# Patient Record
Sex: Female | Born: 1950
Health system: Southern US, Community
[De-identification: ages and names within clinical notes are randomized; demographics above are authoritative.]

## PROBLEM LIST (undated history)

## (undated) DIAGNOSIS — F329 Major depressive disorder, single episode, unspecified: Secondary | ICD-10-CM

## (undated) DIAGNOSIS — E785 Hyperlipidemia, unspecified: Secondary | ICD-10-CM

## (undated) DIAGNOSIS — K648 Other hemorrhoids: Secondary | ICD-10-CM

## (undated) DIAGNOSIS — F32A Depression, unspecified: Secondary | ICD-10-CM

## (undated) HISTORY — DX: Depression, unspecified: F32.A

## (undated) HISTORY — DX: Major depressive disorder, single episode, unspecified: F32.9

## (undated) HISTORY — DX: Hyperlipidemia, unspecified: E78.5

## (undated) HISTORY — DX: Other hemorrhoids: K64.8

---

## 1999-01-14 ENCOUNTER — Ambulatory Visit (HOSPITAL_COMMUNITY): Admission: RE | Admit: 1999-01-14 | Discharge: 1999-01-14 | Payer: Self-pay | Admitting: *Deleted

## 1999-01-14 ENCOUNTER — Encounter: Payer: Self-pay | Admitting: Family Medicine

## 1999-04-12 ENCOUNTER — Encounter: Payer: Self-pay | Admitting: Family Medicine

## 1999-04-12 ENCOUNTER — Ambulatory Visit (HOSPITAL_COMMUNITY): Admission: RE | Admit: 1999-04-12 | Discharge: 1999-04-12 | Payer: Self-pay | Admitting: *Deleted

## 2000-03-13 ENCOUNTER — Other Ambulatory Visit: Admission: RE | Admit: 2000-03-13 | Discharge: 2000-03-13 | Payer: Self-pay | Admitting: Obstetrics and Gynecology

## 2001-10-25 ENCOUNTER — Encounter: Payer: Self-pay | Admitting: Emergency Medicine

## 2001-10-25 ENCOUNTER — Emergency Department (HOSPITAL_COMMUNITY): Admission: EM | Admit: 2001-10-25 | Discharge: 2001-10-25 | Payer: Self-pay | Admitting: Emergency Medicine

## 2002-07-14 ENCOUNTER — Ambulatory Visit: Admission: RE | Admit: 2002-07-14 | Discharge: 2002-07-14 | Payer: Self-pay | Admitting: Family Medicine

## 2003-07-20 ENCOUNTER — Encounter: Payer: Self-pay | Admitting: Family Medicine

## 2003-07-20 ENCOUNTER — Ambulatory Visit (HOSPITAL_COMMUNITY): Admission: RE | Admit: 2003-07-20 | Discharge: 2003-07-20 | Payer: Self-pay | Admitting: Family Medicine

## 2003-09-29 ENCOUNTER — Other Ambulatory Visit: Admission: RE | Admit: 2003-09-29 | Discharge: 2003-09-29 | Payer: Self-pay | Admitting: Family Medicine

## 2004-04-01 HISTORY — PX: COLONOSCOPY: SHX174

## 2004-10-21 ENCOUNTER — Ambulatory Visit (HOSPITAL_COMMUNITY): Admission: RE | Admit: 2004-10-21 | Discharge: 2004-10-21 | Payer: Self-pay | Admitting: Family Medicine

## 2004-11-12 ENCOUNTER — Other Ambulatory Visit: Admission: RE | Admit: 2004-11-12 | Discharge: 2004-11-12 | Payer: Self-pay | Admitting: Family Medicine

## 2005-06-11 ENCOUNTER — Ambulatory Visit (HOSPITAL_COMMUNITY): Admission: RE | Admit: 2005-06-11 | Discharge: 2005-06-11 | Payer: Self-pay | Admitting: Family Medicine

## 2005-07-11 ENCOUNTER — Ambulatory Visit (HOSPITAL_BASED_OUTPATIENT_CLINIC_OR_DEPARTMENT_OTHER): Admission: RE | Admit: 2005-07-11 | Discharge: 2005-07-11 | Payer: Self-pay | Admitting: Family Medicine

## 2005-07-13 ENCOUNTER — Ambulatory Visit: Payer: Self-pay | Admitting: Internal Medicine

## 2006-04-14 ENCOUNTER — Other Ambulatory Visit: Admission: RE | Admit: 2006-04-14 | Discharge: 2006-04-14 | Payer: Self-pay | Admitting: Family Medicine

## 2006-05-01 ENCOUNTER — Encounter: Admission: RE | Admit: 2006-05-01 | Discharge: 2006-05-01 | Payer: Self-pay | Admitting: Family Medicine

## 2012-08-17 ENCOUNTER — Telehealth: Payer: Self-pay | Admitting: Gastroenterology

## 2012-08-17 NOTE — Telephone Encounter (Signed)
I have left a message for Angelique Blonder to return my call.

## 2012-08-18 NOTE — Telephone Encounter (Signed)
Left message for patient to call back  

## 2012-08-19 NOTE — Telephone Encounter (Signed)
Unable to reach [facility].  I have contacted the patient , she reports no further bleeding since seeing [facility].  She is offered an appt for 09/14/12, but unable to come due to work schedules.  She is scheduled to see Dr. Russella Dar on 09/27/12, she will call me back if the bleeding returns and we will work her in with one of the extenders.

## 2012-09-08 ENCOUNTER — Encounter: Payer: Self-pay | Admitting: Gastroenterology

## 2012-09-27 ENCOUNTER — Ambulatory Visit (INDEPENDENT_AMBULATORY_CARE_PROVIDER_SITE_OTHER): Payer: Managed Care, Other (non HMO) | Admitting: Gastroenterology

## 2012-09-27 VITALS — BP 110/70 | HR 76 | Ht 65.25 in | Wt 201.0 lb

## 2012-09-27 DIAGNOSIS — K921 Melena: Secondary | ICD-10-CM

## 2012-09-27 MED ORDER — PEG-KCL-NACL-NASULF-NA ASC-C 100 G PO SOLR: 1.0000 | Freq: Once | ORAL | Status: DC

## 2012-09-27 NOTE — Patient Instructions (Addendum)
You have been scheduled for a colonoscopy with propofol. Please follow written instructions given to you at your visit today.  Please pick up your prep kit at the pharmacy within the next 1-3 days. If you use inhalers (even only as needed) or a CPAP machine, please bring them with you on the day of your procedure.  cc: Bennie Pierini, NP

## 2012-09-27 NOTE — Progress Notes (Signed)
History of Present Illness: This is a 61 year old female who relates a 6 month history of intermittent small-volume bright red blood per rectum. She had one episode of a large amount of blood per rectum and she was evaluated by her PCP. Blood work unremarkable. She previously underwent colonoscopy 2005 showing internal hemorrhoids. She has not noted any rectal bleeding since the visit to her PCP office in mid-September. Denies weight loss, abdominal pain, constipation, diarrhea, change in stool caliber, melena, nausea, vomiting, dysphagia, reflux symptoms, chest pain.  Review of mid-September. Systems: Pertinent positive and negative review of systems were noted in the above HPI section. All other review of systems were otherwise negative.  Current Medications, Allergies, Past Medical History, Past Surgical History, Family History and Social History were reviewed in [employer] record.  Physical Exam: General: Well developed , well nourished, no acute distress Head: Normocephalic and atraumatic Eyes:  sclerae anicteric, EOMI Ears: Normal auditory acuity Mouth: No deformity or lesions Neck: Supple, no masses or thyromegaly Lungs: Clear throughout to auscultation Heart: Regular rate and rhythm; no murmurs, rubs or bruits Abdomen: Soft, non tender and non distended. No masses, hepatosplenomegaly or hernias noted. Normal Bowel sounds Rectal: No lesions, brown Hemoccult negative stool the vault  Musculoskeletal: Symmetrical with no gross deformities  Skin: No lesions on visible extremities Pulses:  Normal pulses noted Extremities: No clubbing, cyanosis, edema or deformities noted Neurological: Alert oriented x 4, grossly nonfocal Cervical Nodes:  No significant cervical adenopathy Inguinal Nodes: No significant inguinal adenopathy Psychological:  Alert and cooperative. Normal mood and affect  Assessment and Recommendations:  1. Hematochezia. Probable hemorrhoidal  bleeding. Rule out colorectal neoplasms and other disorders. Schedule colonoscopy. The risks, benefits, and alternatives to colonoscopy with possible biopsy and possible polypectomy were discussed with the patient and they consent to proceed.

## 2012-10-29 ENCOUNTER — Encounter: Payer: Managed Care, Other (non HMO) | Admitting: Gastroenterology

## 2012-12-10 ENCOUNTER — Encounter: Payer: Managed Care, Other (non HMO) | Admitting: Gastroenterology

## 2013-02-28 ENCOUNTER — Other Ambulatory Visit: Payer: Self-pay | Admitting: Nurse Practitioner

## 2013-03-29 ENCOUNTER — Other Ambulatory Visit: Payer: Self-pay | Admitting: Nurse Practitioner

## 2013-03-30 NOTE — Telephone Encounter (Signed)
LAST LABS 9/13 

## 2013-04-21 ENCOUNTER — Telehealth: Payer: Self-pay | Admitting: Nurse Practitioner

## 2013-04-21 NOTE — Telephone Encounter (Signed)
Patient should be able to call and make no appointment since she is in their system- if can't let me know

## 2013-04-21 NOTE — Telephone Encounter (Signed)
Mmm please address referral

## 2013-04-22 NOTE — Telephone Encounter (Signed)
Left message  (at work). Pt can call to reschedule her appt at [facility].

## 2013-05-11 ENCOUNTER — Encounter: Payer: Self-pay | Admitting: Gastroenterology

## 2013-05-31 ENCOUNTER — Other Ambulatory Visit: Payer: Self-pay

## 2013-05-31 MED ORDER — SIMVASTATIN 40 MG PO TABS: 40.0000 mg | ORAL_TABLET | Freq: Every day | ORAL | Status: DC

## 2013-05-31 NOTE — Telephone Encounter (Signed)
Last lipids 08/11/12  Last seen 08/11/12  MMM

## 2013-06-21 ENCOUNTER — Ambulatory Visit (AMBULATORY_SURGERY_CENTER): Payer: Managed Care, Other (non HMO) | Admitting: *Deleted

## 2013-06-21 VITALS — Ht 66.0 in | Wt 203.2 lb

## 2013-06-21 DIAGNOSIS — K921 Melena: Secondary | ICD-10-CM

## 2013-06-21 NOTE — Progress Notes (Signed)
Previous colon 04-01-2004 w/ hemorrhoids only. ewm No egg or soy product allergy. ewm Nothing electrical in heart, no home 02 use. ewm Has been diagnosed with mild OSA but doesn't use CPAP. ewm No email available for emmi video. ewm No problems with last sedation w/ colon. ewm Pt has moviprep at home, unmixed from last scheduled colon so no refill sent to pharmacy. ewm

## 2013-06-28 ENCOUNTER — Encounter: Payer: Self-pay | Admitting: Gastroenterology

## 2013-07-12 ENCOUNTER — Other Ambulatory Visit: Payer: Self-pay

## 2013-07-12 NOTE — Telephone Encounter (Signed)
Last lipids drawn 08/11/12   MMM

## 2013-07-12 NOTE — Telephone Encounter (Signed)
No refill ntbs for labs

## 2013-07-14 ENCOUNTER — Encounter: Payer: Self-pay | Admitting: Gastroenterology

## 2013-07-14 ENCOUNTER — Ambulatory Visit (AMBULATORY_SURGERY_CENTER): Payer: Managed Care, Other (non HMO) | Admitting: Gastroenterology

## 2013-07-14 VITALS — BP 123/68 | HR 75 | Temp 98.4°F | Resp 14 | Ht 66.0 in | Wt 203.0 lb

## 2013-07-14 DIAGNOSIS — K921 Melena: Secondary | ICD-10-CM

## 2013-07-14 DIAGNOSIS — D126 Benign neoplasm of colon, unspecified: Secondary | ICD-10-CM

## 2013-07-14 DIAGNOSIS — K648 Other hemorrhoids: Secondary | ICD-10-CM

## 2013-07-14 MED ORDER — SODIUM CHLORIDE 0.9 % IV SOLN: 500.0000 mL | INTRAVENOUS | Status: DC

## 2013-07-14 NOTE — Patient Instructions (Addendum)

## 2013-07-14 NOTE — Progress Notes (Signed)
Patient did not experience any of the following events: a burn prior to discharge; a fall within the facility; wrong site/side/patient/procedure/implant event; or a hospital transfer or hospital admission upon discharge from the facility. (G8907) Patient did not have preoperative order for IV antibiotic SSI prophylaxis. (G8918)  

## 2013-07-14 NOTE — Op Note (Signed)
[facility] [address] [place], [address]   COLONOSCOPY PROCEDURE REPORT  PATIENT: Mikayla Knight, Mikayla Knight  MR#: [id number] BIRTHDATE: [birth year] , 62  yrs. old GENDER: Female ENDOSCOPIST: Meryl Dare, MD, [facility] PROCEDURE DATE:  07/14/2013 PROCEDURE:   Hemorrhoidectomy via sclerosing and Colonoscopy with snare polypectomy First Screening Colonoscopy - Avg.  risk and is 50 yrs.  old or older - No.  Prior Negative Screening - Now for repeat screening. N/A  History of Adenoma - Now for follow-up colonoscopy & has been > or = to 3 yrs.  N/A  Polyps Removed Today? No.  Recommend repeat exam, <10 yrs? No. ASA CLASS:   Class II INDICATIONS:hematochezia. MEDICATIONS: MAC sedation, administered by CRNA and propofol (Diprivan) 300mg  IV DESCRIPTION OF PROCEDURE:   After the risks benefits and alternatives of the procedure were thoroughly explained, informed consent was obtained.  A digital rectal exam revealed no abnormalities of the rectum.   The LB [id number]  endoscope was introduced through the anus and advanced to the cecum, which was identified by both the appendix and ileocecal valve. No adverse events experienced with a tortuous colon.   The quality of the prep was excellent, using MoviPrep  The instrument was then slowly withdrawn as the colon was fully examined.  COLON FINDINGS: A sessile polyp measuring 6 mm in size was found in the transverse colon.  A polypectomy was performed with a cold snare.  The resection was complete and the polyp tissue was completely retrieved.   The colon was otherwise normal.  There was no diverticulosis, inflammation, polyps or cancers unless previously stated.  Retroflexed views revealed moderate internal hemorrhoids. 2 cc of 23.4% saline was injected into the internal hemorrhoids above the dentate line. The time to cecum=7 minutes 56 seconds.  Withdrawal time=14 minutes 30 seconds.  The scope was withdrawn and the procedure  completed. COMPLICATIONS: There were no complications.  ENDOSCOPIC IMPRESSION: 1.   Sessile polyp measuring 6 mm in the transverse colon; polypectomy performed with a cold snare 2.   Moderate internal hemorrhoids  RECOMMENDATIONS: 1.  Colonoscopy in 5 years if polyps is adenomatous, otherwise repeat colonoscopy in 10 years. 2.  Await polyp pathology   eSigned:  Meryl Dare, MD, [facility] 07/14/2013 11:03 AM   cc: Bennie Pierini, NP

## 2013-07-14 NOTE — Progress Notes (Signed)
Called to room to assist during endoscopic procedure.  Patient ID and intended procedure confirmed with present staff. Received instructions for my participation in the procedure from the performing physician.  

## 2013-07-14 NOTE — Progress Notes (Signed)
Report to pacu rn, vss, bbs=clear 

## 2013-07-15 ENCOUNTER — Telehealth: Payer: Self-pay | Admitting: *Deleted

## 2013-07-15 ENCOUNTER — Encounter: Payer: Self-pay | Admitting: *Deleted

## 2013-07-15 ENCOUNTER — Other Ambulatory Visit: Payer: Self-pay

## 2013-07-15 MED ORDER — SIMVASTATIN 40 MG PO TABS: 40.0000 mg | ORAL_TABLET | Freq: Every day | ORAL | Status: DC

## 2013-07-15 NOTE — Telephone Encounter (Signed)
Message left

## 2013-07-15 NOTE — Telephone Encounter (Signed)
Last lipids 08/11/12  MMM

## 2013-07-19 ENCOUNTER — Encounter: Payer: Self-pay | Admitting: Gastroenterology

## 2013-09-05 ENCOUNTER — Other Ambulatory Visit: Payer: Self-pay | Admitting: Nurse Practitioner

## 2013-09-06 ENCOUNTER — Other Ambulatory Visit: Payer: Self-pay | Admitting: Nurse Practitioner

## 2013-09-29 ENCOUNTER — Ambulatory Visit (INDEPENDENT_AMBULATORY_CARE_PROVIDER_SITE_OTHER): Payer: Managed Care, Other (non HMO) | Admitting: Nurse Practitioner

## 2013-09-29 ENCOUNTER — Encounter: Payer: Self-pay | Admitting: Nurse Practitioner

## 2013-09-29 VITALS — BP 132/84 | HR 80 | Temp 99.1°F | Ht 66.0 in | Wt 203.0 lb

## 2013-09-29 DIAGNOSIS — E785 Hyperlipidemia, unspecified: Secondary | ICD-10-CM

## 2013-09-29 DIAGNOSIS — F329 Major depressive disorder, single episode, unspecified: Secondary | ICD-10-CM

## 2013-09-29 DIAGNOSIS — F32A Depression, unspecified: Secondary | ICD-10-CM

## 2013-09-29 MED ORDER — PAROXETINE HCL ER 37.5 MG PO TB24: 37.5000 mg | ORAL_TABLET | Freq: Every day | ORAL | Status: DC

## 2013-09-29 NOTE — Patient Instructions (Signed)
Fat and Cholesterol Control Diet  Fat and cholesterol levels in your blood and organs are influenced by your diet. High levels of fat and cholesterol may lead to diseases of the heart, small and large blood vessels, gallbladder, liver, and pancreas.  CONTROLLING FAT AND CHOLESTEROL WITH DIET  Although exercise and lifestyle factors are important, your diet is key. That is because certain foods are known to raise cholesterol and others to lower it. The goal is to balance foods for their effect on cholesterol and more importantly, to replace saturated and trans fat with other types of fat, such as monounsaturated fat, polyunsaturated fat, and omega-3 fatty acids.  On average, a person should consume no more than 15 to 17 g of saturated fat daily. Saturated and trans fats are considered "bad" fats, and they will raise LDL cholesterol. Saturated fats are primarily found in animal products such as meats, butter, and cream. However, that does not mean you need to give up all your favorite foods. Today, there are good tasting, low-fat, low-cholesterol substitutes for most of the things you like to eat. Choose low-fat or nonfat alternatives. Choose round or loin cuts of red meat. These types of cuts are lowest in fat and cholesterol. Chicken (without the skin), fish, veal, and ground turkey breast are great choices. Eliminate fatty meats, such as hot dogs and salami. Even shellfish have little or no saturated fat. Have a 3 oz (85 g) portion when you eat lean meat, poultry, or fish.  Trans fats are also called "partially hydrogenated oils." They are oils that have been scientifically manipulated so that they are solid at room temperature resulting in a longer shelf life and improved taste and texture of foods in which they are added. Trans fats are found in stick margarine, some tub margarines, cookies, crackers, and baked goods.   When baking and cooking, oils are a great substitute for butter. The monounsaturated oils are  especially beneficial since it is believed they lower LDL and raise HDL. The oils you should avoid entirely are saturated tropical oils, such as coconut and palm.   Remember to eat a lot from food groups that are naturally free of saturated and trans fat, including fish, fruit, vegetables, beans, grains (barley, rice, couscous, bulgur wheat), and pasta (without cream sauces).   IDENTIFYING FOODS THAT LOWER FAT AND CHOLESTEROL   Soluble fiber may lower your cholesterol. This type of fiber is found in fruits such as apples, vegetables such as broccoli, potatoes, and carrots, legumes such as beans, peas, and lentils, and grains such as barley. Foods fortified with plant sterols (phytosterol) may also lower cholesterol. You should eat at least 2 g per day of these foods for a cholesterol lowering effect.   Read package labels to identify low-saturated fats, trans fat free, and low-fat foods at the supermarket. Select cheeses that have only 2 to 3 g saturated fat per ounce. Use a heart-healthy tub margarine that is free of trans fats or partially hydrogenated oil. When buying baked goods (cookies, crackers), avoid partially hydrogenated oils. Breads and muffins should be made from whole grains (whole-wheat or whole oat flour, instead of "flour" or "enriched flour"). Buy non-creamy canned soups with reduced salt and no added fats.   FOOD PREPARATION TECHNIQUES   Never deep-fry. If you must fry, either stir-fry, which uses very little fat, or use non-stick cooking sprays. When possible, broil, bake, or roast meats, and steam vegetables. Instead of putting butter or margarine on vegetables, use lemon   and herbs, applesauce, and cinnamon (for squash and sweet potatoes). Use nonfat yogurt, salsa, and low-fat dressings for salads.   LOW-SATURATED FAT / LOW-FAT FOOD SUBSTITUTES  Meats / Saturated Fat (g)  · Avoid: Steak, marbled (3 oz/85 g) / 11 g  · Choose: Steak, lean (3 oz/85 g) / 4 g  · Avoid: Hamburger (3 oz/85 g) / 7  g  · Choose: Hamburger, lean (3 oz/85 g) / 5 g  · Avoid: Ham (3 oz/85 g) / 6 g  · Choose: Ham, lean cut (3 oz/85 g) / 2.4 g  · Avoid: Chicken, with skin, dark meat (3 oz/85 g) / 4 g  · Choose: Chicken, skin removed, dark meat (3 oz/85 g) / 2 g  · Avoid: Chicken, with skin, light meat (3 oz/85 g) / 2.5 g  · Choose: Chicken, skin removed, light meat (3 oz/85 g) / 1 g  Dairy / Saturated Fat (g)  · Avoid: Whole milk (1 cup) / 5 g  · Choose: Low-fat milk, 2% (1 cup) / 3 g  · Choose: Low-fat milk, 1% (1 cup) / 1.5 g  · Choose: Skim milk (1 cup) / 0.3 g  · Avoid: Hard cheese (1 oz/28 g) / 6 g  · Choose: Skim milk cheese (1 oz/28 g) / 2 to 3 g  · Avoid: Cottage cheese, 4% fat (1 cup) / 6.5 g  · Choose: Low-fat cottage cheese, 1% fat (1 cup) / 1.5 g  · Avoid: Ice cream (1 cup) / 9 g  · Choose: Sherbet (1 cup) / 2.5 g  · Choose: Nonfat frozen yogurt (1 cup) / 0.3 g  · Choose: Frozen fruit bar / trace  · Avoid: Whipped cream (1 tbs) / 3.5 g  · Choose: Nondairy whipped topping (1 tbs) / 1 g  Condiments / Saturated Fat (g)  · Avoid: Mayonnaise (1 tbs) / 2 g  · Choose: Low-fat mayonnaise (1 tbs) / 1 g  · Avoid: Butter (1 tbs) / 7 g  · Choose: Extra light margarine (1 tbs) / 1 g  · Avoid: Coconut oil (1 tbs) / 11.8 g  · Choose: Olive oil (1 tbs) / 1.8 g  · Choose: Corn oil (1 tbs) / 1.7 g  · Choose: Safflower oil (1 tbs) / 1.2 g  · Choose: Sunflower oil (1 tbs) / 1.4 g  · Choose: Soybean oil (1 tbs) / 2.4 g  · Choose: Canola oil (1 tbs) / 1 g  Document Released: 11/10/2005 Document Revised: 03/07/2013 Document Reviewed: 05/01/2011  ExitCare® Patient Information ©2014 [employer].

## 2013-09-29 NOTE — Progress Notes (Signed)
  Subjective:    Patient ID: Mikayla Knight, female    DOB: June 01, 1951, 62 y.o.   MRN: [id number]  Hyperlipidemia This is a chronic problem. The current episode started more than 1 year ago. The problem is uncontrolled. Recent lipid tests were reviewed and are high. Exacerbating diseases include obesity. She has no history of diabetes or hypothyroidism. There are no known factors aggravating her hyperlipidemia. Pertinent negatives include no focal sensory loss, focal weakness, leg pain or myalgias. Shortness of breath: slight with exertion. Treatments tried: patient stopped taking statin 6 months ago. The current treatment provides no improvement of lipids. Compliance problems include adherence to diet and adherence to exercise.  Risk factors for coronary artery disease include obesity and post-menopausal.  depression Paxil- patient has been taking for a long time- no side effects- works well for her.    Review of Systems  Constitutional: Negative.   HENT: Negative.   Eyes: Negative.   Respiratory: Shortness of breath: slight with exertion.   Cardiovascular: Negative.   Gastrointestinal: Negative.   Genitourinary: Negative.   Musculoskeletal: Negative.  Negative for myalgias.  Neurological: Negative.  Negative for focal weakness.  Hematological: Negative.   Psychiatric/Behavioral: Negative.        Objective:   Physical Exam  Constitutional: She is oriented to person, place, and time. She appears well-developed and well-nourished.  HENT:  Nose: Nose normal.  Mouth/Throat: Oropharynx is clear and moist.  Eyes: EOM are normal.  Neck: Trachea normal, normal range of motion and full passive range of motion without pain. Neck supple. No JVD present. Carotid bruit is not present. No thyromegaly present.  Cardiovascular: Normal rate, regular rhythm, normal heart sounds and intact distal pulses.  Exam reveals no gallop and no friction rub.   No murmur heard. Pulmonary/Chest: Effort normal  and breath sounds normal.  Abdominal: Soft. Bowel sounds are normal. She exhibits no distension and no mass. There is no tenderness.  Musculoskeletal: Normal range of motion.  Lymphadenopathy:    She has no cervical adenopathy.  Neurological: She is alert and oriented to person, place, and time. She has normal reflexes.  Skin: Skin is warm and dry.  Psychiatric: She has a normal mood and affect. Her behavior is normal. Judgment and thought content normal.     BP 142/69  Pulse 80  Temp(Src) 99.1 F (37.3 C) (Oral)  Ht 5\' 6"  (1.676 m)  Wt 203 lb (92.08 kg)  BMI 32.78 kg/m2      Assessment & Plan:   1. Depression   2. Hyperlipidemia LDL goal < 100    Orders Placed This Encounter  Procedures  . CMP14+EGFR  . NMR, lipoprofile    Meds ordered this encounter  Medications  . PARoxetine (PAXIL-CR) 37.5 MG 24 hr tablet    Sig: Take 1 tablet (37.5 mg total) by mouth daily.    Dispense:  30 tablet    Refill:  5    Order Specific Question:  Supervising Provider    Answer:  Deborra Medina    Continue all meds Labs pending Diet and exercise encouraged Health maintenance reviewed Follow up in 6 month  Mary-Margaret Daphine Deutscher, FNP

## 2013-10-01 LAB — CMP14+EGFR
Albumin: 4.1 g/dL (ref 3.6–4.8)
Calcium: 9 mg/dL (ref 8.6–10.2)
Chloride: 104 mmol/L (ref 97–108)
Creatinine, Ser: 0.75 mg/dL (ref 0.57–1.00)
GFR calc non Af Amer: 86 mL/min/{1.73_m2} (ref >59)
Glucose: 81 mg/dL (ref 65–99)

## 2013-10-01 LAB — NMR, LIPOPROFILE
LP-IR Score: 58 — ABNORMAL HIGH (ref <=45)
Triglycerides by NMR: 154 mg/dL — ABNORMAL HIGH (ref <150)

## 2013-10-18 ENCOUNTER — Telehealth: Payer: Self-pay | Admitting: Nurse Practitioner

## 2013-10-24 NOTE — Telephone Encounter (Signed)
Patient aware of labs will call back after she reviews everything and will let us know if she wants to go on a statin

## 2014-01-16 ENCOUNTER — Telehealth: Payer: Self-pay | Admitting: Nurse Practitioner

## 2014-01-16 NOTE — Telephone Encounter (Signed)
appt made for sinus

## 2014-01-17 ENCOUNTER — Ambulatory Visit: Payer: Managed Care, Other (non HMO) | Admitting: Family Medicine

## 2014-01-19 ENCOUNTER — Ambulatory Visit: Payer: Managed Care, Other (non HMO) | Admitting: Family Medicine

## 2014-04-21 ENCOUNTER — Other Ambulatory Visit: Payer: Self-pay | Admitting: Nurse Practitioner

## 2014-05-22 ENCOUNTER — Other Ambulatory Visit: Payer: Self-pay | Admitting: Nurse Practitioner

## 2014-05-23 NOTE — Telephone Encounter (Signed)
Last seen 09/29/13  MMM

## 2014-05-23 NOTE — Telephone Encounter (Signed)
Patient NTBS for follow up and lab work for future refills  

## 2014-06-22 ENCOUNTER — Encounter: Payer: Self-pay | Admitting: Nurse Practitioner

## 2014-06-22 ENCOUNTER — Ambulatory Visit (INDEPENDENT_AMBULATORY_CARE_PROVIDER_SITE_OTHER): Payer: BC Managed Care – PPO | Admitting: Nurse Practitioner

## 2014-06-22 VITALS — BP 123/79 | HR 85 | Temp 97.7°F | Ht 66.0 in | Wt 200.0 lb

## 2014-06-22 DIAGNOSIS — E785 Hyperlipidemia, unspecified: Secondary | ICD-10-CM

## 2014-06-22 DIAGNOSIS — Z713 Dietary counseling and surveillance: Secondary | ICD-10-CM

## 2014-06-22 DIAGNOSIS — F32A Depression, unspecified: Secondary | ICD-10-CM

## 2014-06-22 DIAGNOSIS — F329 Major depressive disorder, single episode, unspecified: Secondary | ICD-10-CM

## 2014-06-22 DIAGNOSIS — Z6832 Body mass index (BMI) 32.0-32.9, adult: Secondary | ICD-10-CM

## 2014-06-22 DIAGNOSIS — F3289 Other specified depressive episodes: Secondary | ICD-10-CM

## 2014-06-22 MED ORDER — PAROXETINE HCL ER 37.5 MG PO TB24: ORAL_TABLET | ORAL | Status: DC

## 2014-06-22 NOTE — Patient Instructions (Signed)
Exercise to Lose Weight Exercise and a healthy diet may help you lose weight. Your doctor may suggest specific exercises. EXERCISE IDEAS AND TIPS  Choose low-cost things you enjoy doing, such as walking, bicycling, or exercising to workout videos.  Take stairs instead of the elevator.  Walk during your lunch break.  Park your car further away from work or school.  Go to a gym or an exercise class.  Start with 5 to 10 minutes of exercise each day. Build up to 30 minutes of exercise 4 to 6 days a week.  Wear shoes with good support and comfortable clothes.  Stretch before and after working out.  Work out until you breathe harder and your heart beats faster.  Drink extra water when you exercise.  Do not do so much that you hurt yourself, feel dizzy, or get very short of breath. Exercises that burn about 150 calories:  Running 1  miles in 15 minutes.  Playing volleyball for 45 to 60 minutes.  Washing and waxing a car for 45 to 60 minutes.  Playing touch football for 45 minutes.  Walking 1  miles in 35 minutes.  Pushing a stroller 1  miles in 30 minutes.  Playing basketball for 30 minutes.  Raking leaves for 30 minutes.  Bicycling 5 miles in 30 minutes.  Walking 2 miles in 30 minutes.  Dancing for 30 minutes.  Shoveling snow for 15 minutes.  Swimming laps for 20 minutes.  Walking up stairs for 15 minutes.  Bicycling 4 miles in 15 minutes.  Gardening for 30 to 45 minutes.  Jumping rope for 15 minutes.  Washing windows or floors for 45 to 60 minutes. Document Released: 12/13/2010 Document Revised: 02/02/2012 Document Reviewed: 12/13/2010 ExitCare Patient Information 2015 [employer]. This information is not intended to replace advice given to you by your health care provider. Make sure you discuss any questions you have with your health care provider.  

## 2014-06-22 NOTE — Progress Notes (Signed)
  Subjective:    Patient ID: Mikayla Knight, female    DOB: 04-Jun-1951, 63 y.o.   MRN: [id number]  Patient here today for follow up of chronic medical problems.  Hyperlipidemia This is a chronic problem. The current episode started more than 1 year ago. The problem is uncontrolled. Recent lipid tests were reviewed and are high. Exacerbating diseases include obesity. She has no history of diabetes or hypothyroidism. There are no known factors aggravating her hyperlipidemia. Pertinent negatives include no focal sensory loss, focal weakness, leg pain or myalgias. Shortness of breath: slight with exertion. Treatments tried: patient stopped taking statin 6 months ago says she had slight muscle aches. The current treatment provides no improvement of lipids. Compliance problems include adherence to diet and adherence to exercise.  Risk factors for coronary artery disease include obesity and post-menopausal.  depression Paxil- patient has been taking for a long time- no side effects- works well for her.    Review of Systems  Constitutional: Negative.   HENT: Negative.   Eyes: Negative.   Respiratory: Shortness of breath: slight with exertion.   Cardiovascular: Negative.   Gastrointestinal: Negative.   Genitourinary: Negative.   Musculoskeletal: Negative.  Negative for myalgias.  Neurological: Negative.  Negative for focal weakness.  Hematological: Negative.   Psychiatric/Behavioral: Negative.        Objective:   Physical Exam  Constitutional: She is oriented to person, place, and time. She appears well-developed and well-nourished.  HENT:  Nose: Nose normal.  Mouth/Throat: Oropharynx is clear and moist.  Eyes: EOM are normal.  Neck: Trachea normal, normal range of motion and full passive range of motion without pain. Neck supple. No JVD present. Carotid bruit is not present. No thyromegaly present.  Cardiovascular: Normal rate, regular rhythm, normal heart sounds and intact distal pulses.   Exam reveals no gallop and no friction rub.   No murmur heard. Pulmonary/Chest: Effort normal and breath sounds normal.  Abdominal: Soft. Bowel sounds are normal. She exhibits no distension and no mass. There is no tenderness.  Musculoskeletal: Normal range of motion.  Lymphadenopathy:    She has no cervical adenopathy.  Neurological: She is alert and oriented to person, place, and time. She has normal reflexes.  Skin: Skin is warm and dry.  Psychiatric: She has a normal mood and affect. Her behavior is normal. Judgment and thought content normal.     BP 123/79  Pulse 85  Temp(Src) 97.7 F (36.5 C) (Oral)  Ht $R'5\' 6"'TO$  (1.676 m)  Wt 200 lb (90.719 kg)  BMI 32.30 kg/m2      Assessment & Plan:   1. Hyperlipidemia with target LDL less than 100   2. Depression   3. BMI 32.0-32.9,adult   4. Weight loss counseling, encounter for    Orders Placed This Encounter  Procedures  . CMP14+EGFR  . NMR, lipoprofile   Meds ordered this encounter  Medications  . PARoxetine (PAXIL-CR) 37.5 MG 24 hr tablet    Sig: TAKE ONE TABLET BY MOUTH ONCE DAILY    Dispense:  30 tablet    Refill:  5    Order Specific Question:  Supervising Provider    Answer:  Chipper Herb [[id number]]   Patient to schedule mammogram on mobile truck Labs pending Health maintenance reviewed Diet and exercise encouraged Continue all meds Follow up  In 3 months   [place], FNP

## 2014-06-22 NOTE — Addendum Note (Signed)
Addended by: Selmer Dominion on: 06/22/2014 02:25 PM   Modules accepted: Orders

## 2014-06-23 LAB — CMP14+EGFR
ALT: 12 IU/L (ref 0–32)
AST: 12 IU/L (ref 0–40)
Albumin/Globulin Ratio: 1.9 (ref 1.1–2.5)
Albumin: 4.4 g/dL (ref 3.6–4.8)
Alkaline Phosphatase: 76 IU/L (ref 39–117)
BUN / CREAT RATIO: 26 (ref 11–26)
BUN: 19 mg/dL (ref 8–27)
CALCIUM: 9.3 mg/dL (ref 8.7–10.3)
CHLORIDE: 101 mmol/L (ref 97–108)
CO2: 24 mmol/L (ref 18–29)
CREATININE: 0.74 mg/dL (ref 0.57–1.00)
GFR calc Af Amer: 100 mL/min/{1.73_m2} (ref >59)
GFR, EST NON AFRICAN AMERICAN: 87 mL/min/{1.73_m2} (ref >59)
GLUCOSE: 95 mg/dL (ref 65–99)
Globulin, Total: 2.3 g/dL (ref 1.5–4.5)
Potassium: 5 mmol/L (ref 3.5–5.2)
Sodium: 139 mmol/L (ref 134–144)
Total Bilirubin: 0.4 mg/dL (ref 0.0–1.2)
Total Protein: 6.7 g/dL (ref 6.0–8.5)

## 2014-06-23 LAB — NMR, LIPOPROFILE

## 2014-06-24 LAB — HEPATIC FUNCTION PANEL
ALT: 14 IU/L (ref 0–32)
AST: 13 IU/L (ref 0–40)
Albumin: 4.5 g/dL (ref 3.6–4.8)
Alkaline Phosphatase: 78 IU/L (ref 39–117)
BILIRUBIN TOTAL: 0.4 mg/dL (ref 0.0–1.2)
Bilirubin, Direct: 0.09 mg/dL (ref 0.00–0.40)
TOTAL PROTEIN: 6.8 g/dL (ref 6.0–8.5)

## 2014-06-24 LAB — LIPID PANEL
CHOL/HDL RATIO: 7.3 ratio — AB (ref 0.0–4.4)
Cholesterol, Total: 290 mg/dL — ABNORMAL HIGH (ref 100–199)
HDL: 40 mg/dL (ref >39)
LDL CALC: 213 mg/dL — AB (ref 0–99)
Triglycerides: 187 mg/dL — ABNORMAL HIGH (ref 0–149)
VLDL CHOLESTEROL CAL: 37 mg/dL (ref 5–40)

## 2014-06-24 LAB — SPECIMEN STATUS REPORT

## 2014-10-02 ENCOUNTER — Encounter (INDEPENDENT_AMBULATORY_CARE_PROVIDER_SITE_OTHER): Payer: Self-pay

## 2014-10-02 ENCOUNTER — Ambulatory Visit (INDEPENDENT_AMBULATORY_CARE_PROVIDER_SITE_OTHER): Payer: BC Managed Care – PPO | Admitting: Nurse Practitioner

## 2014-10-02 ENCOUNTER — Encounter: Payer: Self-pay | Admitting: Nurse Practitioner

## 2014-10-02 VITALS — BP 119/76 | HR 79 | Temp 97.2°F | Ht 66.0 in | Wt 202.8 lb

## 2014-10-02 DIAGNOSIS — E785 Hyperlipidemia, unspecified: Secondary | ICD-10-CM

## 2014-10-02 DIAGNOSIS — F329 Major depressive disorder, single episode, unspecified: Secondary | ICD-10-CM

## 2014-10-02 DIAGNOSIS — F32A Depression, unspecified: Secondary | ICD-10-CM

## 2014-10-02 MED ORDER — PAROXETINE HCL ER 37.5 MG PO TB24: ORAL_TABLET | ORAL | Status: DC

## 2014-10-02 NOTE — Progress Notes (Signed)
  Subjective:    Patient ID: Mikayla Knight, female    DOB: 12/09/1950, 63 y.o.   MRN: [id number]  Patient here today for follow up of chronic medical problems.  Hyperlipidemia This is a chronic problem. The current episode started more than 1 year ago. The problem is uncontrolled. Recent lipid tests were reviewed and are high. Exacerbating diseases include obesity. She has no history of diabetes or hypothyroidism. Pertinent negatives include no myalgias. Shortness of breath: slight with exertion. She is currently on no antihyperlipidemic treatment. The current treatment provides mild improvement of lipids. Compliance problems include adherence to diet and adherence to exercise.  Risk factors for coronary artery disease include post-menopausal and dyslipidemia.  depression Paxil- patient has been taking for a long time- no side effects- works well for her.    Review of Systems  Constitutional: Negative.   HENT: Negative.   Eyes: Negative.   Respiratory: Shortness of breath: slight with exertion.   Cardiovascular: Negative.   Gastrointestinal: Negative.   Genitourinary: Negative.   Musculoskeletal: Negative.  Negative for myalgias.  Neurological: Negative.   Hematological: Negative.   Psychiatric/Behavioral: Negative.        Objective:   Physical Exam  Constitutional: She is oriented to person, place, and time. She appears well-developed and well-nourished.  HENT:  Nose: Nose normal.  Mouth/Throat: Oropharynx is clear and moist.  Eyes: EOM are normal.  Neck: Trachea normal, normal range of motion and full passive range of motion without pain. Neck supple. No JVD present. Carotid bruit is not present. No thyromegaly present.  Cardiovascular: Normal rate, regular rhythm, normal heart sounds and intact distal pulses.  Exam reveals no gallop and no friction rub.   No murmur heard. Pulmonary/Chest: Effort normal and breath sounds normal.  Abdominal: Soft. Bowel sounds are normal. She  exhibits no distension and no mass. There is no tenderness.  Musculoskeletal: Normal range of motion.  Lymphadenopathy:    She has no cervical adenopathy.  Neurological: She is alert and oriented to person, place, and time. She has normal reflexes.  Skin: Skin is warm and dry.  Psychiatric: She has a normal mood and affect. Her behavior is normal. Judgment and thought content normal.     BP 119/76 mmHg  Pulse 79  Temp(Src) 97.2 F (36.2 C) (Oral)  Ht 5' 6" (1.676 m)  Wt 202 lb 12.8 oz (91.989 kg)  BMI 32.75 kg/m2      Assessment & Plan:   1. Depression Stress managenment - PARoxetine (PAXIL-CR) 37.5 MG 24 hr tablet; TAKE ONE TABLET BY MOUTH ONCE DAILY  Dispense: 30 tablet; Refill: 5  2. Hyperlipidemia with target LDL less than 100 Low fat diet and exercsie - CMP14+EGFR - NMR, lipoprofile    Labs pending Health maintenance reviewed Diet and exercise encouraged Continue all meds Follow up  In 6 months    [place], FNP

## 2014-10-02 NOTE — Patient Instructions (Signed)
Fat and Cholesterol Control Diet Fat and cholesterol levels in your blood and organs are influenced by your diet. High levels of fat and cholesterol may lead to diseases of the heart, small and large blood vessels, gallbladder, liver, and pancreas. CONTROLLING FAT AND CHOLESTEROL WITH DIET Although exercise and lifestyle factors are important, your diet is key. That is because certain foods are known to raise cholesterol and others to lower it. The goal is to balance foods for their effect on cholesterol and more importantly, to replace saturated and trans fat with other types of fat, such as monounsaturated fat, polyunsaturated fat, and omega-3 fatty acids. On average, a person should consume no more than 15 to 17 g of saturated fat daily. Saturated and trans fats are considered "bad" fats, and they will raise LDL cholesterol. Saturated fats are primarily found in animal products such as meats, butter, and cream. However, that does not mean you need to give up all your favorite foods. Today, there are good tasting, low-fat, low-cholesterol substitutes for most of the things you like to eat. Choose low-fat or nonfat alternatives. Choose round or loin cuts of red meat. These types of cuts are lowest in fat and cholesterol. Chicken (without the skin), fish, veal, and ground turkey breast are great choices. Eliminate fatty meats, such as hot dogs and salami. Even shellfish have little or no saturated fat. Have a 3 oz (85 g) portion when you eat lean meat, poultry, or fish. Trans fats are also called "partially hydrogenated oils." They are oils that have been scientifically manipulated so that they are solid at room temperature resulting in a longer shelf life and improved taste and texture of foods in which they are added. Trans fats are found in stick margarine, some tub margarines, cookies, crackers, and baked goods.  When baking and cooking, oils are a great substitute for butter. The monounsaturated oils are  especially beneficial since it is believed they lower LDL and raise HDL. The oils you should avoid entirely are saturated tropical oils, such as coconut and palm.  Remember to eat a lot from food groups that are naturally free of saturated and trans fat, including fish, fruit, vegetables, beans, grains (barley, rice, couscous, bulgur wheat), and pasta (without cream sauces).  IDENTIFYING FOODS THAT LOWER FAT AND CHOLESTEROL  Soluble fiber may lower your cholesterol. This type of fiber is found in fruits such as apples, vegetables such as broccoli, potatoes, and carrots, legumes such as beans, peas, and lentils, and grains such as barley. Foods fortified with plant sterols (phytosterol) may also lower cholesterol. You should eat at least 2 g per day of these foods for a cholesterol lowering effect.  Read package labels to identify low-saturated fats, trans fat free, and low-fat foods at the supermarket. Select cheeses that have only 2 to 3 g saturated fat per ounce. Use a heart-healthy tub margarine that is free of trans fats or partially hydrogenated oil. When buying baked goods (cookies, crackers), avoid partially hydrogenated oils. Breads and muffins should be made from whole grains (whole-wheat or whole oat flour, instead of "flour" or "enriched flour"). Buy non-creamy canned soups with reduced salt and no added fats.  FOOD PREPARATION TECHNIQUES  Never deep-fry. If you must fry, either stir-fry, which uses very little fat, or use non-stick cooking sprays. When possible, broil, bake, or roast meats, and steam vegetables. Instead of putting butter or margarine on vegetables, use lemon and herbs, applesauce, and cinnamon (for squash and sweet potatoes). Use nonfat   yogurt, salsa, and low-fat dressings for salads.  LOW-SATURATED FAT / LOW-FAT FOOD SUBSTITUTES Meats / Saturated Fat (g)  Avoid: Steak, marbled (3 oz/85 g) / 11 g  Choose: Steak, lean (3 oz/85 g) / 4 g  Avoid: Hamburger (3 oz/85 g) / 7  g  Choose: Hamburger, lean (3 oz/85 g) / 5 g  Avoid: Ham (3 oz/85 g) / 6 g  Choose: Ham, lean cut (3 oz/85 g) / 2.4 g  Avoid: Chicken, with skin, dark meat (3 oz/85 g) / 4 g  Choose: Chicken, skin removed, dark meat (3 oz/85 g) / 2 g  Avoid: Chicken, with skin, light meat (3 oz/85 g) / 2.5 g  Choose: Chicken, skin removed, light meat (3 oz/85 g) / 1 g Dairy / Saturated Fat (g)  Avoid: Whole milk (1 cup) / 5 g  Choose: Low-fat milk, 2% (1 cup) / 3 g  Choose: Low-fat milk, 1% (1 cup) / 1.5 g  Choose: Skim milk (1 cup) / 0.3 g  Avoid: Hard cheese (1 oz/28 g) / 6 g  Choose: Skim milk cheese (1 oz/28 g) / 2 to 3 g  Avoid: Cottage cheese, 4% fat (1 cup) / 6.5 g  Choose: Low-fat cottage cheese, 1% fat (1 cup) / 1.5 g  Avoid: Ice cream (1 cup) / 9 g  Choose: Sherbet (1 cup) / 2.5 g  Choose: Nonfat frozen yogurt (1 cup) / 0.3 g  Choose: Frozen fruit bar / trace  Avoid: Whipped cream (1 tbs) / 3.5 g  Choose: Nondairy whipped topping (1 tbs) / 1 g Condiments / Saturated Fat (g)  Avoid: Mayonnaise (1 tbs) / 2 g  Choose: Low-fat mayonnaise (1 tbs) / 1 g  Avoid: Butter (1 tbs) / 7 g  Choose: Extra light margarine (1 tbs) / 1 g  Avoid: Coconut oil (1 tbs) / 11.8 g  Choose: Olive oil (1 tbs) / 1.8 g  Choose: Corn oil (1 tbs) / 1.7 g  Choose: Safflower oil (1 tbs) / 1.2 g  Choose: Sunflower oil (1 tbs) / 1.4 g  Choose: Soybean oil (1 tbs) / 2.4 g  Choose: Canola oil (1 tbs) / 1 g Document Released: 11/10/2005 Document Revised: 03/07/2013 Document Reviewed: 02/08/2014 [employer] Patient Information 2015 [employer]. This information is not intended to replace advice given to you by your health care provider. Make sure you discuss any questions you have with your health care provider.  

## 2014-10-03 ENCOUNTER — Telehealth: Payer: Self-pay | Admitting: *Deleted

## 2014-10-03 ENCOUNTER — Other Ambulatory Visit: Payer: Self-pay | Admitting: Nurse Practitioner

## 2014-10-03 LAB — CMP14+EGFR
ALBUMIN: 4.3 g/dL (ref 3.6–4.8)
ALT: 13 IU/L (ref 0–32)
AST: 15 IU/L (ref 0–40)
Albumin/Globulin Ratio: 2 (ref 1.1–2.5)
Alkaline Phosphatase: 74 IU/L (ref 39–117)
BUN/Creatinine Ratio: 23 (ref 11–26)
BUN: 17 mg/dL (ref 8–27)
CALCIUM: 9.4 mg/dL (ref 8.7–10.3)
CO2: 27 mmol/L (ref 18–29)
CREATININE: 0.75 mg/dL (ref 0.57–1.00)
Chloride: 103 mmol/L (ref 97–108)
GFR, EST AFRICAN AMERICAN: 99 mL/min/{1.73_m2} (ref >59)
GFR, EST NON AFRICAN AMERICAN: 86 mL/min/{1.73_m2} (ref >59)
GLUCOSE: 85 mg/dL (ref 65–99)
Globulin, Total: 2.2 g/dL (ref 1.5–4.5)
Potassium: 4.8 mmol/L (ref 3.5–5.2)
SODIUM: 143 mmol/L (ref 134–144)
Total Bilirubin: 0.2 mg/dL (ref 0.0–1.2)
Total Protein: 6.5 g/dL (ref 6.0–8.5)

## 2014-10-03 LAB — NMR, LIPOPROFILE
Cholesterol: 278 mg/dL — ABNORMAL HIGH (ref 100–199)
HDL Cholesterol by NMR: 38 mg/dL — ABNORMAL LOW (ref >39)
HDL Particle Number: 30.1 umol/L — ABNORMAL LOW (ref >=30.5)
LDL Particle Number: 2789 nmol/L — ABNORMAL HIGH (ref <1000)
LDL SIZE: 19.8 nm (ref >20.5)
LDL-C: 196 mg/dL — ABNORMAL HIGH (ref 0–99)
LP-IR Score: 65 — ABNORMAL HIGH (ref <=45)
Small LDL Particle Number: 1927 nmol/L — ABNORMAL HIGH (ref <=527)
TRIGLYCERIDES BY NMR: 222 mg/dL — AB (ref 0–149)

## 2014-10-03 MED ORDER — ATORVASTATIN CALCIUM 40 MG PO TABS: 40.0000 mg | ORAL_TABLET | Freq: Every day | ORAL | Status: DC

## 2014-10-03 NOTE — Telephone Encounter (Signed)
Pt notified of results  Verbalizes understanding Pt is willing to try a statin for cholesterol numbers

## 2014-10-03 NOTE — Telephone Encounter (Signed)
-----   Message from [facility], [place] sent at 10/03/2014 10:08 AM EST ----- Kidney and liver function stable LDL particle  numbers  And LDL look terrible- trig are elevated- really needs to be on a statin- will take?

## 2015-01-02 ENCOUNTER — Ambulatory Visit (INDEPENDENT_AMBULATORY_CARE_PROVIDER_SITE_OTHER): Payer: BLUE CROSS/BLUE SHIELD

## 2015-01-02 ENCOUNTER — Ambulatory Visit (INDEPENDENT_AMBULATORY_CARE_PROVIDER_SITE_OTHER): Payer: BLUE CROSS/BLUE SHIELD | Admitting: Nurse Practitioner

## 2015-01-02 ENCOUNTER — Encounter: Payer: Self-pay | Admitting: Nurse Practitioner

## 2015-01-02 VITALS — BP 106/65 | HR 81 | Temp 98.7°F | Ht 66.0 in | Wt 200.6 lb

## 2015-01-02 DIAGNOSIS — Z720 Tobacco use: Secondary | ICD-10-CM

## 2015-01-02 DIAGNOSIS — E785 Hyperlipidemia, unspecified: Secondary | ICD-10-CM

## 2015-01-02 DIAGNOSIS — Z87891 Personal history of nicotine dependence: Secondary | ICD-10-CM

## 2015-01-02 DIAGNOSIS — Z0000 Encounter for general adult medical examination without abnormal findings: Secondary | ICD-10-CM

## 2015-01-02 DIAGNOSIS — Z23 Encounter for immunization: Secondary | ICD-10-CM

## 2015-01-02 DIAGNOSIS — F32A Depression, unspecified: Secondary | ICD-10-CM

## 2015-01-02 DIAGNOSIS — Z01419 Encounter for gynecological examination (general) (routine) without abnormal findings: Secondary | ICD-10-CM

## 2015-01-02 DIAGNOSIS — F329 Major depressive disorder, single episode, unspecified: Secondary | ICD-10-CM

## 2015-01-02 LAB — POCT URINALYSIS DIPSTICK
Bilirubin, UA: NEGATIVE
Blood, UA: NEGATIVE
Glucose, UA: NEGATIVE
Ketones, UA: NEGATIVE
Nitrite, UA: NEGATIVE
Protein, UA: NEGATIVE
Spec Grav, UA: 1.025
UROBILINOGEN UA: NEGATIVE
pH, UA: 5

## 2015-01-02 LAB — POCT CBC
GRANULOCYTE PERCENT: 64.2 % (ref 37–80)
HCT, POC: 40.2 % (ref 37.7–47.9)
Hemoglobin: 12.8 g/dL (ref 12.2–16.2)
Lymph, poc: 1.8 (ref 0.6–3.4)
MCH, POC: 29.3 pg (ref 27–31.2)
MCHC: 31.8 g/dL (ref 31.8–35.4)
MCV: 92.1 fL (ref 80–97)
MPV: 7.8 fL (ref 0–99.8)
PLATELET COUNT, POC: 226 10*3/uL (ref 142–424)
POC Granulocyte: 3.9 (ref 2–6.9)
POC LYMPH %: 30.5 % (ref 10–50)
RBC: 4.4 M/uL (ref 4.04–5.48)
RDW, POC: 13 %
WBC: 6 10*3/uL (ref 4.6–10.2)

## 2015-01-02 LAB — POCT UA - MICROSCOPIC ONLY
BACTERIA, U MICROSCOPIC: NEGATIVE
CASTS, UR, LPF, POC: NEGATIVE
Crystals, Ur, HPF, POC: NEGATIVE
Mucus, UA: NEGATIVE
YEAST UA: NEGATIVE

## 2015-01-02 MED ORDER — PAROXETINE HCL ER 37.5 MG PO TB24: ORAL_TABLET | ORAL | Status: DC

## 2015-01-02 MED ORDER — ATORVASTATIN CALCIUM 40 MG PO TABS: 40.0000 mg | ORAL_TABLET | Freq: Every day | ORAL | Status: DC

## 2015-01-02 NOTE — Patient Instructions (Addendum)
Food Choices to Lower Your Triglycerides  Triglycerides are a type of fat in your blood. High levels of triglycerides can increase the risk of heart disease and stroke. If your triglyceride levels are high, the foods you eat and your eating habits are very important. Choosing the right foods can help lower your triglycerides.  WHAT GENERAL GUIDELINES DO I NEED TO FOLLOW?  Lose weight if you are overweight.   Limit or avoid alcohol.   Fill one half of your plate with vegetables and green salads.   Limit fruit to two servings a day. Choose fruit instead of juice.   Make one fourth of your plate whole grains. Look for the word "whole" as the first word in the ingredient list.  Fill one fourth of your plate with lean protein foods.  Enjoy fatty fish (such as salmon, mackerel, sardines, and tuna) three times a week.   Choose healthy fats.   Limit foods high in starch and sugar.  Eat more home-cooked food and less restaurant, buffet, and fast food.  Limit fried foods.  Cook foods using methods other than frying.  Limit saturated fats.  Check ingredient lists to avoid foods with partially hydrogenated oils (trans fats) in them. WHAT FOODS CAN I EAT?  Grains Whole grains, such as whole wheat or whole grain breads, crackers, cereals, and pasta. Unsweetened oatmeal, bulgur, barley, quinoa, or brown rice. Corn or whole wheat flour tortillas.  Vegetables Fresh or frozen vegetables (raw, steamed, roasted, or grilled). Green salads. Fruits All fresh, canned (in natural juice), or frozen fruits. Meat and Other Protein Products Ground beef (85% or leaner), grass-fed beef, or beef trimmed of fat. Skinless chicken or [place]. Ground chicken or [place]. Pork trimmed of fat. All fish and seafood. Eggs. Dried beans, peas, or lentils. Unsalted nuts or seeds. Unsalted canned or dry beans. Dairy Low-fat dairy products, such as skim or 1% milk, 2% or reduced-fat cheeses, low-fat ricotta or  cottage cheese, or plain low-fat yogurt. Fats and Oils Tub margarines without trans fats. Light or reduced-fat mayonnaise and salad dressings. Avocado. Safflower, olive, or canola oils. Natural peanut or almond butter. The items listed above may not be a complete list of recommended foods or beverages. Contact your dietitian for more options. WHAT FOODS ARE NOT RECOMMENDED?  Grains White bread. White pasta. White rice. Cornbread. Bagels, pastries, and croissants. Crackers that contain trans fat. Vegetables White potatoes. Corn. Creamed or fried vegetables. Vegetables in a cheese sauce. Fruits Dried fruits. Canned fruit in light or heavy syrup. Fruit juice. Meat and Other Protein Products Fatty cuts of meat. Ribs, chicken wings, bacon, sausage, bologna, salami, chitterlings, fatback, hot dogs, bratwurst, and packaged luncheon meats. Dairy Whole or 2% milk, cream, half-and-half, and cream cheese. Whole-fat or sweetened yogurt. Full-fat cheeses. Nondairy creamers and whipped toppings. Processed cheese, cheese spreads, or cheese curds. Sweets and Desserts Corn syrup, sugars, honey, and molasses. Candy. Jam and jelly. Syrup. Sweetened cereals. Cookies, pies, cakes, donuts, muffins, and ice cream. Fats and Oils Butter, stick margarine, lard, shortening, ghee, or bacon fat. Coconut, palm kernel, or palm oils. Beverages Alcohol. Sweetened drinks (such as sodas, lemonade, and fruit drinks or punches). The items listed above may not be a complete list of foods and beverages to avoid. Contact your dietitian for more information. Document Released: 08/28/2004 Document Revised: 11/15/2013 Document Reviewed: 09/14/2013 [facility] Patient Information 2015 [place], [place]. This information is not intended to replace advice given to you by your health care provider. Make sure you discuss  any questions you have with your health care provider.   What are Advance Directives? A living will allows you to document  your wishes concerning medical treatments at the end of life.   Before your living will can guide medical decision-making two physicians must certify: You are unable to make medical decisions,  You are in the medical condition specified in the state's living will law (such as "terminal illness" or "permanent unconsciousness"),  Other requirements also may apply, depending upon the state. A medical power of attorney (or healthcare proxy) allows you to appoint a person you trust as your healthcare agent (or surrogate decision maker), who is authorized to make medical decisions on your behalf.   Before a medical power of attorney goes into effect a persons physician must conclude that they are unable to make their own medical decisions. In addition: If a person regains the ability to make decisions, the agent cannot continue to act on the person's behalf.  Many states have additional requirements that apply only to decisions about life-sustaining medical treatments.  For example, before your agent can refuse a life-sustaining treatment on your behalf, a second physician may have to confirm your doctor's assessment that you are incapable of making treatment decisions. What Else Do I Need to Know?  Advance directives are legally valid throughout the [place]. While you do not need a lawyer to fill out an advance directive, your advance directive becomes legally valid as soon as you sign them in front of the required witnesses. The laws governing advance directives vary from state to state, so it is important to complete and sign advance directives that comply with your state's law. Also, advance directives can have different titles in different states.  Emergency medical technicians cannot honor living wills or medical powers of attorney. Once emergency personnel have been called, they must do what is necessary to stabilize a person for transfer to a hospital, both from accident sites and from a home  or other facility. After a physician fully evaluates the person's condition and determines the underlying conditions, advance directives can be implemented.  One states advance directive does not always work in another state. Some states do honor advance directives from another state; others will honor out-of-state advance directives as long as they are similar to the state's own law; and some states do not have an answer to this question. The best solution is if you spend a significant amount of time in more than one state, you should complete the advance directives for all the states you spend a significant amount of time in.  Advance directives do not expire. An advance directive remains in effect until you change it. If you complete a new advance directive, it invalidates the previous one.  You should review your advance directives periodically to ensure that they still reflect your wishes. If you want to change anything in an advance directive once you have completed it, you should complete a whole new document. [facility], [email or web address]

## 2015-01-02 NOTE — Progress Notes (Signed)
Subjective:    Patient ID: Mikayla Knight, female    DOB: 1951-08-22, 64 y.o.   MRN: [id number]  Patient is here today annual physical exam and PAP- no complaints today.  Hyperlipidemia This is a chronic problem. The current episode started more than 1 year ago. The problem is uncontrolled. Recent lipid tests were reviewed and are high. Exacerbating diseases include obesity. She has no history of diabetes or hypothyroidism. Pertinent negatives include no myalgias. Shortness of breath: slight with exertion. She is currently on no antihyperlipidemic treatment. The current treatment provides mild improvement of lipids. Compliance problems include adherence to diet and adherence to exercise.  Risk factors for coronary artery disease include post-menopausal and dyslipidemia.  depression Paxil- patient has been taking for a long time- no side effects- works well for her.    Review of Systems  Constitutional: Negative.   HENT: Negative.   Eyes: Negative.   Respiratory: Shortness of breath: slight with exertion.   Cardiovascular: Negative.   Gastrointestinal: Negative.   Genitourinary: Negative.   Musculoskeletal: Negative.  Negative for myalgias.  Neurological: Negative.   Hematological: Negative.   Psychiatric/Behavioral: Negative.        Objective:   Physical Exam  Constitutional: She is oriented to person, place, and time. She appears well-developed and well-nourished.  HENT:  Head: Normocephalic.  Right Ear: Hearing, tympanic membrane, external ear and ear canal normal.  Left Ear: Hearing, tympanic membrane, external ear and ear canal normal.  Nose: Nose normal.  Mouth/Throat: Uvula is midline and oropharynx is clear and moist.  Eyes: Conjunctivae and EOM are normal. Pupils are equal, round, and reactive to light.  Neck: Trachea normal, normal range of motion and full passive range of motion without pain. Neck supple. No JVD present. Carotid bruit is not present. No thyroid mass  and no thyromegaly present.  Cardiovascular: Normal rate, regular rhythm, normal heart sounds and intact distal pulses.  Exam reveals no gallop and no friction rub.   No murmur heard. Pulmonary/Chest: Effort normal and breath sounds normal. Right breast exhibits no inverted nipple, no mass, no nipple discharge, no skin change and no tenderness. Left breast exhibits no inverted nipple, no mass, no nipple discharge, no skin change and no tenderness.  Abdominal: Soft. Bowel sounds are normal. She exhibits no distension and no mass. There is no tenderness.  Genitourinary: Vagina normal and uterus normal. No breast swelling, tenderness, discharge or bleeding.  bimanual exam-No adnexal masses or tenderness. Cervix parous and pink- no discharge  Musculoskeletal: Normal range of motion.  Lymphadenopathy:    She has no cervical adenopathy.  Neurological: She is alert and oriented to person, place, and time. She has normal reflexes.  Skin: Skin is warm and dry.  Psychiatric: She has a normal mood and affect. Her behavior is normal. Judgment and thought content normal.    Chest x-ray- normal- no acute findings-Preliminary reading by Ronnald Collum, FNP  [facility]  EKG- NSR-[name], FNP   BP 106/65 mmHg  Pulse 81  Temp(Src) 98.7 F (37.1 C) (Oral)  Ht _0  (1.676 m)  Wt 200 lb 9.6 oz (90.992 kg)  BMI 32.39 kg/m2       Assessment & Plan:   1. Annual physical exam - POCT UA - Microscopic Only - POCT urinalysis dipstick - POCT CBC - Thyroid Panel With TSH  2. Hyperlipidemia with target LDL less than 100 Low fat diet - CMP14+EGFR - NMR, lipoprofile - EKG 12-Lead  3. Depression Stress management  4. Encounter  for routine gynecological examination - Pap IG w/ reflex to HPV when ASC-U  5. Smoking hx - DG Chest 2 View; Future  Meds ordered this encounter  Medications  . atorvastatin (LIPITOR) 40 MG tablet    Sig: Take 1 tablet (40 mg total) by mouth daily.    Dispense:   90 tablet    Refill:  1    Order Specific Question:  Supervising Provider    Answer:  Chipper Herb [[id number]]  . PARoxetine (PAXIL-CR) 37.5 MG 24 hr tablet    Sig: TAKE ONE TABLET BY MOUTH ONCE DAILY    Dispense:  30 tablet    Refill:  5    Order Specific Question:  Supervising Provider    Answer:  Chipper Herb [[id number]]    Hemoccult cards given to patient with directions Labs pending Health maintenance reviewed Diet and exercise encouraged Continue all meds Follow up  In 3 month   [place], FNP

## 2015-01-03 LAB — CMP14+EGFR
ALK PHOS: 97 IU/L (ref 39–117)
ALT: 15 IU/L (ref 0–32)
AST: 17 IU/L (ref 0–40)
Albumin/Globulin Ratio: 1.8 (ref 1.1–2.5)
Albumin: 4.4 g/dL (ref 3.6–4.8)
BUN / CREAT RATIO: 22 (ref 11–26)
BUN: 17 mg/dL (ref 8–27)
Bilirubin Total: 0.4 mg/dL (ref 0.0–1.2)
CO2: 23 mmol/L (ref 18–29)
Calcium: 9.3 mg/dL (ref 8.7–10.3)
Chloride: 105 mmol/L (ref 97–108)
Creatinine, Ser: 0.76 mg/dL (ref 0.57–1.00)
GFR calc Af Amer: 97 mL/min/{1.73_m2} (ref >59)
GFR calc non Af Amer: 84 mL/min/{1.73_m2} (ref >59)
Globulin, Total: 2.4 g/dL (ref 1.5–4.5)
Glucose: 95 mg/dL (ref 65–99)
POTASSIUM: 5 mmol/L (ref 3.5–5.2)
Sodium: 144 mmol/L (ref 134–144)
Total Protein: 6.8 g/dL (ref 6.0–8.5)

## 2015-01-03 LAB — THYROID PANEL WITH TSH
Free Thyroxine Index: 2.2 (ref 1.2–4.9)
T3 Uptake Ratio: 23 % — ABNORMAL LOW (ref 24–39)
T4, Total: 9.7 ug/dL (ref 4.5–12.0)
TSH: 2.56 u[IU]/mL (ref 0.450–4.500)

## 2015-01-03 LAB — NMR, LIPOPROFILE
Cholesterol: 170 mg/dL (ref 100–199)
HDL CHOLESTEROL BY NMR: 45 mg/dL (ref >39)
HDL PARTICLE NUMBER: 32.9 umol/L (ref >=30.5)
LDL Particle Number: 1349 nmol/L — ABNORMAL HIGH (ref <1000)
LDL Size: 20.3 nm (ref >20.5)
LDL-C: 106 mg/dL — ABNORMAL HIGH (ref 0–99)
LP-IR Score: 53 — ABNORMAL HIGH (ref <=45)
Small LDL Particle Number: 773 nmol/L — ABNORMAL HIGH (ref <=527)
TRIGLYCERIDES BY NMR: 97 mg/dL (ref 0–149)

## 2015-01-04 ENCOUNTER — Encounter: Payer: Self-pay | Admitting: Nurse Practitioner

## 2015-01-05 LAB — PAP IG W/ RFLX HPV ASCU: PAP Smear Comment: 0

## 2015-01-08 ENCOUNTER — Telehealth: Payer: Self-pay | Admitting: *Deleted

## 2015-01-08 NOTE — Telephone Encounter (Signed)
-----   Message from Chevis Pretty, [place] sent at 01/05/2015  5:13 PM EST ----- Pap normal repeat in 2 years

## 2015-01-08 NOTE — Telephone Encounter (Signed)
Pt notified of results Verbalizes understanding 

## 2015-07-12 ENCOUNTER — Telehealth: Payer: Self-pay | Admitting: Nurse Practitioner

## 2015-07-12 DIAGNOSIS — F329 Major depressive disorder, single episode, unspecified: Secondary | ICD-10-CM

## 2015-07-12 DIAGNOSIS — F32A Depression, unspecified: Secondary | ICD-10-CM

## 2015-07-12 MED ORDER — PAROXETINE HCL ER 37.5 MG PO TB24: ORAL_TABLET | ORAL | Status: DC

## 2015-07-12 NOTE — Telephone Encounter (Signed)
paxil rx sent to pharmacy

## 2015-07-12 NOTE — Telephone Encounter (Signed)
Patient notified that script was sent to [place]

## 2015-08-06 ENCOUNTER — Other Ambulatory Visit: Payer: Self-pay | Admitting: Nurse Practitioner

## 2015-08-07 NOTE — Telephone Encounter (Signed)
lipitor rx denied- Patient NTBS for follow up and lab work

## 2015-08-07 NOTE — Telephone Encounter (Signed)
Last seen and last lipid 01/02/15  MMM

## 2015-08-07 NOTE — Telephone Encounter (Signed)
Patient aware she must be seen

## 2015-12-04 ENCOUNTER — Telehealth: Payer: Self-pay

## 2015-12-04 MED ORDER — VALACYCLOVIR HCL 1 G PO TABS: 1000.0000 mg | ORAL_TABLET | Freq: Three times a day (TID) | ORAL | Status: DC

## 2015-12-04 NOTE — Telephone Encounter (Signed)
Patient aware that rx has been sent to pharmacy.  °

## 2015-12-04 NOTE — Telephone Encounter (Signed)
Patient states she is having a flare up of herpes, would like for [place] to call in Valtrex to [facility]

## 2015-12-04 NOTE — Telephone Encounter (Signed)
Valtrex I sent the Rx to the pharmacy.  

## 2016-01-01 ENCOUNTER — Encounter: Payer: Self-pay | Admitting: Nurse Practitioner

## 2016-01-01 ENCOUNTER — Ambulatory Visit (INDEPENDENT_AMBULATORY_CARE_PROVIDER_SITE_OTHER): Payer: BLUE CROSS/BLUE SHIELD | Admitting: Nurse Practitioner

## 2016-01-01 VITALS — BP 136/82 | HR 83 | Temp 97.0°F | Ht 66.0 in | Wt 205.0 lb

## 2016-01-01 DIAGNOSIS — F329 Major depressive disorder, single episode, unspecified: Secondary | ICD-10-CM | POA: Diagnosis not present

## 2016-01-01 DIAGNOSIS — R300 Dysuria: Secondary | ICD-10-CM

## 2016-01-01 DIAGNOSIS — F32A Depression, unspecified: Secondary | ICD-10-CM

## 2016-01-01 LAB — POCT UA - MICROSCOPIC ONLY
Casts, Ur, LPF, POC: NEGATIVE
Crystals, Ur, HPF, POC: NEGATIVE
MUCUS UA: NEGATIVE
Yeast, UA: NEGATIVE

## 2016-01-01 LAB — POCT URINALYSIS DIPSTICK
BILIRUBIN UA: NEGATIVE
Blood, UA: NEGATIVE
Glucose, UA: NEGATIVE
KETONES UA: NEGATIVE
Nitrite, UA: NEGATIVE
PH UA: 5
PROTEIN UA: NEGATIVE
Spec Grav, UA: 1.02
Urobilinogen, UA: NEGATIVE

## 2016-01-01 MED ORDER — NITROFURANTOIN MONOHYD MACRO 100 MG PO CAPS: 100.0000 mg | ORAL_CAPSULE | Freq: Two times a day (BID) | ORAL | Status: DC

## 2016-01-01 MED ORDER — PAROXETINE HCL ER 37.5 MG PO TB24: ORAL_TABLET | ORAL | Status: DC

## 2016-01-01 NOTE — Patient Instructions (Signed)
Asymptomatic Bacteriuria, Female Asymptomatic bacteriuria is the presence of a large number of bacteria in your urine without the usual symptoms of burning or frequent urination. The following conditions increase the risk of asymptomatic bacteriuria:  Diabetes mellitus.  Advanced age.  Pregnancy in the first trimester.  Kidney stones.  Kidney transplants.  Leaky kidney tube valve in young children (reflux). Treatment for this condition is not needed in most people and can lead to other problems such as too much yeast and growth of resistant bacteria. However, some people, such as pregnant women, do need treatment to prevent kidney infection. Asymptomatic bacteriuria in pregnancy is also associated with fetal growth restriction, premature labor, and newborn death. HOME CARE INSTRUCTIONS Monitor your condition for any changes. The following actions may help to relieve any discomfort you are feeling:  Drink enough water and fluids to keep your urine clear or pale yellow. Go to the bathroom more often to keep your bladder empty.  Keep the area around your vagina and rectum clean. Wipe yourself from front to back after urinating. SEEK IMMEDIATE MEDICAL CARE IF:  You develop signs of an infection such as:  Burning with urination.  Frequency of voiding.  Back pain.  Fever.  You have blood in the urine.  You develop a fever. MAKE SURE YOU:  Understand these instructions.  Will watch your condition.  Will get help right away if you are not doing well or get worse.   This information is not intended to replace advice given to you by your health care provider. Make sure you discuss any questions you have with your health care provider.   Document Released: 11/10/2005 Document Revised: 12/01/2014 Document Reviewed: 05/02/2013 Elsevier Interactive Patient Education 2016 [employer]  

## 2016-01-01 NOTE — Progress Notes (Signed)
  Subjective:    Mikayla Knight is a 65 y.o. female who complains of burning with urination, dysuria, frequency, suprapubic pressure and urgency. She has had symptoms for 4 days. Patient also complains of back pain. Patient denies vaginal discharge. Patient does not have a history of recurrent UTI. Patient does not have a history of pyelonephritis.   The following portions of the patient's history were reviewed and updated as appropriate: allergies, current medications, past family history, past medical history, past social history, past surgical history and problem list.  Review of Systems Pertinent items are noted in HPI.    Objective:    BP 136/82 mmHg  Pulse 83  Temp(Src) 97 F (36.1 C) (Oral)  Ht 5\' 6"  (1.676 m)  Wt 205 lb (92.987 kg)  BMI 33.10 kg/m2 General appearance: alert and cooperative Lungs: clear to auscultation bilaterally Heart: regular rate and rhythm, S1, S2 normal, no murmur, click, rub or gallop Abdomen: soft, non-tender; bowel sounds normal; no masses,  no organomegaly; No CVA tenderness  Laboratory:   Results for orders placed or performed in visit on 01/01/16  POCT urinalysis dipstick  Result Value Ref Range   Color, UA gold    Clarity, UA clear    Glucose, UA neg    Bilirubin, UA neg    Ketones, UA neg    Spec Grav, UA 1.020    Blood, UA neg    pH, UA 5.0    Protein, UA neg    Urobilinogen, UA negative    Nitrite, UA neg    Leukocytes, UA small (1+) (A) Negative  POCT UA - Microscopic Only  Result Value Ref Range   WBC, Ur, HPF, POC 5-6    RBC, urine, microscopic occ    Bacteria, U Microscopic occ    Mucus, UA negative    Epithelial cells, urine per micros moderate    Crystals, Ur, HPF, POC negative    Casts, Ur, LPF, POC negative    Yeast, UA negative        Assessment:    Acute cystitis     Plan:  Take medication as prescribe Cotton underwear Take shower not bath Cranberry juice, yogurt Force fluids AZO over the counter X2  days RTO prn  Meds ordered this encounter  Medications  . PARoxetine (PAXIL-CR) 37.5 MG 24 hr tablet    Sig: TAKE ONE TABLET BY MOUTH ONCE DAILY    Dispense:  30 tablet    Refill:  5    Order Specific Question:  Supervising Provider    Answer:  Chipper Herb [[id number]]  . nitrofurantoin, macrocrystal-monohydrate, (MACROBID) 100 MG capsule    Sig: Take 1 capsule (100 mg total) by mouth 2 (two) times daily. 1 po BId    Dispense:  14 capsule    Refill:  0    Order Specific Question:  Supervising Provider    Answer:  Chipper Herb [[id number]]   [name], FNP

## 2016-01-16 ENCOUNTER — Telehealth: Payer: Self-pay | Admitting: Nurse Practitioner

## 2016-01-16 NOTE — Telephone Encounter (Signed)
Done with 5 refills on 01/01/16. Told pt to call them back

## 2016-07-19 DIAGNOSIS — K047 Periapical abscess without sinus: Secondary | ICD-10-CM | POA: Diagnosis not present

## 2016-09-25 ENCOUNTER — Telehealth: Payer: Self-pay | Admitting: Nurse Practitioner

## 2016-09-25 NOTE — Telephone Encounter (Signed)
Patient has been experiencing headaches and dizziness, BP was elevated when she checked at [facility].  Needs to be seen - appointment made on 09/26/16 at 4:30 with MMM

## 2016-09-26 ENCOUNTER — Ambulatory Visit: Payer: BLUE CROSS/BLUE SHIELD | Admitting: Nurse Practitioner

## 2016-10-24 ENCOUNTER — Encounter: Payer: Self-pay | Admitting: Family Medicine

## 2016-10-24 ENCOUNTER — Ambulatory Visit (INDEPENDENT_AMBULATORY_CARE_PROVIDER_SITE_OTHER): Payer: BLUE CROSS/BLUE SHIELD | Admitting: Family Medicine

## 2016-10-24 VITALS — BP 135/78 | HR 92 | Temp 98.2°F | Ht 66.0 in | Wt 201.8 lb

## 2016-10-24 DIAGNOSIS — J040 Acute laryngitis: Secondary | ICD-10-CM

## 2016-10-24 DIAGNOSIS — J209 Acute bronchitis, unspecified: Secondary | ICD-10-CM | POA: Diagnosis not present

## 2016-10-24 MED ORDER — METHYLPREDNISOLONE ACETATE 80 MG/ML IJ SUSP
80.0000 mg | Freq: Once | INTRAMUSCULAR | Status: AC
  Administered 2016-10-24: 80 mg via INTRAMUSCULAR

## 2016-10-24 MED ORDER — AZITHROMYCIN 250 MG PO TABS: ORAL_TABLET | ORAL | 0 refills | Status: DC

## 2016-10-24 NOTE — Addendum Note (Signed)
Addended by: Nigel Berthold C on: 10/24/2016 03:45 PM   Modules accepted: Orders

## 2016-10-24 NOTE — Progress Notes (Signed)
   HPI  Patient presents today here with hoarseness and cough.  Patient ports that she's been sick with loss of voice for 2 days. She's had cough and congestion for 4-5 days. She denies any shortness of breath, facial pain or pressure. She denies any difficulty tolerating foods and fluids Reports  severe cough worse at night productive of thick green sputum.    PMH: Smoking status noted ROS: Per HPI  Objective: BP 135/78   Pulse 92   Temp 98.2 F (36.8 C) (Oral)   Ht 5\' 6"  (1.676 m)   Wt 201 lb 12.8 oz (91.5 kg)   BMI 32.57 kg/m  Gen: NAD, alert, cooperative with exam HEENT: NCAT, oropharynx clear, TMs normal bilaterally, nares with swollen turbinate on the right Neck: Left-sided tender lymphadenopathy CV: RRR, good S1/S2, no murmur Resp: CTABL, no wheezes, non-labored Ext: No edema, warm Neuro: Alert and oriented, No gross deficits  Assessment and plan:  # Laryngitis, acute bronchitis Treat with IM Depo-Medrol plus azithromycin Antibiotics added with productive cough that seems to be worsening over several day duration Discussed that laryngitis is often solely due to a viral infection and could feasibly improve without antibiotics at all. Supportive care including decrease talking, cough drops, and Tylenol/Chloraseptic Spray discussed      Meds ordered this encounter  Medications  . azithromycin (ZITHROMAX) 250 MG tablet    Sig: Take 2 tablets on day 1 and 1 tablet daily after that    Dispense:  6 tablet    Refill:  0    Laroy Apple, MD [facility] 10/24/2016, 3:32 PM

## 2016-10-24 NOTE — Patient Instructions (Signed)
Great to meet you!   Laryngitis Introduction Laryngitis is inflammation of your vocal cords. This causes hoarseness, coughing, loss of voice, sore throat, or a dry throat. Your vocal cords are two bands of muscles that are found in your throat. When you speak, these cords come together and vibrate. These vibrations come out through your mouth as sound. When your vocal cords are inflamed, your voice sounds different. Laryngitis can be temporary (acute) or long-term (chronic). Most cases of acute laryngitis improve with time. Chronic laryngitis is laryngitis that lasts for more than three weeks. What are the causes? Acute laryngitis may be caused by:  A viral infection.  Lots of talking, yelling, or singing. This is also called vocal strain.  Bacterial infections. Chronic laryngitis may be caused by:  Vocal strain.  Injury to your vocal cords.  Acid reflux (gastroesophageal reflux disease or GERD).  Allergies.  Sinus infection.  Smoking.  Alcohol abuse.  Breathing in chemicals or dust.  Growths on the vocal cords. What increases the risk? Risk factors for laryngitis include:  Smoking.  Alcohol abuse.  Having allergies. What are the signs or symptoms? Symptoms of laryngitis may include:  Low, hoarse voice.  Loss of voice.  Dry cough.  Sore throat.  Stuffy nose. How is this diagnosed? Laryngitis may be diagnosed by:  Physical exam.  Throat culture.  Blood test.  Laryngoscopy. This procedure allows your health care provider to look at your vocal cords with a mirror or viewing tube. How is this treated? Treatment for laryngitis depends on what is causing it. Usually, treatment involves resting your voice and using medicines to soothe your throat. However, if your laryngitis is caused by a bacterial infection, you may need to take antibiotic medicine. If your laryngitis is caused by a growth, you may need to have a procedure to remove it. Follow these  instructions at home:  Drink enough fluid to keep your urine clear or pale yellow.  Breathe in moist air. Use a humidifier if you live in a dry climate.  Take medicines only as directed by your health care provider.  If you were prescribed an antibiotic medicine, finish it all even if you start to feel better.  Do not smoke cigarettes or electronic cigarettes. If you need help quitting, ask your health care provider.  Talk as little as possible. Also avoid whispering, which can cause vocal strain.  Write instead of talking. Do this until your voice is back to normal. Contact a health care provider if:  You have a fever.  You have increasing pain.  You have difficulty swallowing. Get help right away if:  You cough up blood.  You have trouble breathing. This information is not intended to replace advice given to you by your health care provider. Make sure you discuss any questions you have with your health care provider. Document Released: 11/10/2005 Document Revised: 04/17/2016 Document Reviewed: 04/25/2014  2017 Elsevier

## 2017-01-29 ENCOUNTER — Other Ambulatory Visit: Payer: Self-pay | Admitting: Nurse Practitioner

## 2017-01-29 DIAGNOSIS — F32A Depression, unspecified: Secondary | ICD-10-CM

## 2017-01-29 DIAGNOSIS — F329 Major depressive disorder, single episode, unspecified: Secondary | ICD-10-CM

## 2017-01-30 ENCOUNTER — Other Ambulatory Visit: Payer: Self-pay | Admitting: Nurse Practitioner

## 2017-01-30 DIAGNOSIS — F32A Depression, unspecified: Secondary | ICD-10-CM

## 2017-01-30 DIAGNOSIS — F329 Major depressive disorder, single episode, unspecified: Secondary | ICD-10-CM

## 2017-02-16 ENCOUNTER — Ambulatory Visit (INDEPENDENT_AMBULATORY_CARE_PROVIDER_SITE_OTHER): Payer: BLUE CROSS/BLUE SHIELD | Admitting: Nurse Practitioner

## 2017-02-16 ENCOUNTER — Encounter: Payer: Self-pay | Admitting: Nurse Practitioner

## 2017-02-16 VITALS — BP 123/71 | HR 83 | Temp 97.1°F | Ht 66.0 in | Wt 211.0 lb

## 2017-02-16 DIAGNOSIS — Z8041 Family history of malignant neoplasm of ovary: Secondary | ICD-10-CM

## 2017-02-16 DIAGNOSIS — F32A Depression, unspecified: Secondary | ICD-10-CM

## 2017-02-16 DIAGNOSIS — J040 Acute laryngitis: Secondary | ICD-10-CM

## 2017-02-16 DIAGNOSIS — F329 Major depressive disorder, single episode, unspecified: Secondary | ICD-10-CM | POA: Diagnosis not present

## 2017-02-16 MED ORDER — METHYLPREDNISOLONE ACETATE 80 MG/ML IJ SUSP
80.0000 mg | Freq: Once | INTRAMUSCULAR | Status: AC
  Administered 2017-02-16: 80 mg via INTRAMUSCULAR

## 2017-02-16 MED ORDER — PAROXETINE HCL ER 37.5 MG PO TB24: 37.5000 mg | ORAL_TABLET | Freq: Every day | ORAL | 5 refills | Status: DC

## 2017-02-16 NOTE — Progress Notes (Signed)
Subjective:    Patient ID: Mikayla Knight, female    DOB: [birth year], 66 y.o.   MRN: [id number]  Patient here today for follow up of chronic medical problems.  Outpatient Encounter Prescriptions as of 02/16/2017  Medication Sig  . PARoxetine (PAXIL-CR) 37.5 MG 24 hr tablet TAKE ONE TABLET BY MOUTH ONCE DAILY  . valACYclovir (VALTREX) 1000 MG tablet Take 1 tablet (1,000 mg total) by mouth 3 (three) times daily.   Hyperlipidemia  This is a chronic problem. The current episode started more than 1 year ago. The problem is uncontrolled. Recent lipid tests were reviewed and are high. Exacerbating diseases include obesity. She has no history of diabetes or hypothyroidism. Pertinent negatives include no myalgias. Shortness of breath: slight with exertion. She is currently on no antihyperlipidemic treatment. The current treatment provides mild improvement of lipids. Compliance problems include adherence to diet and adherence to exercise.  Risk factors for coronary artery disease include post-menopausal and dyslipidemia.  depression Paxil- patient has been taking for a long time- no side effects- works well for her.  **Sister just diagnosed with follicular lymphoma Mom died of ovarian cancer years ago. Patient has not had pelvic/pap in awhile.   Review of Systems  Constitutional: Negative.   HENT: Positive for voice change. Negative for sore throat.   Eyes: Negative.   Respiratory: Positive for cough (productive with clear/green sputum). Shortness of breath: slight with exertion.   Cardiovascular: Negative.   Gastrointestinal: Negative.   Genitourinary: Negative.   Musculoskeletal: Negative.  Negative for myalgias.  Neurological: Negative.   Hematological: Negative.   Psychiatric/Behavioral: Negative.        Objective:   Physical Exam  Constitutional: She is oriented to person, place, and time. She appears well-developed and well-nourished.  HENT:  Head: Normocephalic.  Right Ear: Hearing,  tympanic membrane, external ear and ear canal normal.  Left Ear: Hearing, tympanic membrane, external ear and ear canal normal.  Nose: Nose normal.  Mouth/Throat: Uvula is midline and oropharynx is clear and moist.  Voice change noted  Eyes: Conjunctivae and EOM are normal. Pupils are equal, round, and reactive to light.  Neck: Trachea normal, normal range of motion and full passive range of motion without pain. Neck supple. No JVD present. Carotid bruit is not present. No thyroid mass and no thyromegaly present.  Cardiovascular: Normal rate, regular rhythm, normal heart sounds and intact distal pulses.  Exam reveals no gallop and no friction rub.   No murmur heard. Pulmonary/Chest: Effort normal and breath sounds normal. Right breast exhibits no inverted nipple, no mass, no nipple discharge, no skin change and no tenderness. Left breast exhibits no inverted nipple, no mass, no nipple discharge, no skin change and no tenderness.  Abdominal: Soft. Bowel sounds are normal. She exhibits no distension and no mass. There is no tenderness.  Genitourinary: No breast swelling, tenderness, discharge or bleeding.  Lymphadenopathy:    She has no cervical adenopathy.  Neurological: She is alert and oriented to person, place, and time.  Skin: Skin is warm and dry. No rash noted.  Psychiatric: She has a normal mood and affect. Her behavior is normal. Judgment and thought content normal.    BP 123/71   Pulse 83   Temp 97.1 F (36.2 C) (Oral)   Ht 5\' 6"  (1.676 m)   Wt 211 lb (95.7 kg)   BMI 34.06 kg/m      Assessment & Plan:  1. Depression, unspecified depression type Stress management - PARoxetine (PAXIL-CR) 37.5 MG  24 hr tablet; Take 1 tablet (37.5 mg total) by mouth daily.  Dispense: 30 tablet; Refill: 5  2. Laryngitis Rest your voice ENT referral for continuation - methylPREDNISolone acetate (DEPO-MEDROL) injection 80 mg; Inject 1 mL (80 mg total) into the muscle once.  3. Family history  of ovarian cancer Mom died of ovarian cancer Schedule Pap - [place] Iaeger, FNP student [place], [place]

## 2017-02-16 NOTE — Patient Instructions (Signed)
Laringitis (Laryngitis) La laringitis es la hinchazn (inflamacin) de las cuerdas vocales. Esto provoca ronquera, tos, prdida de la voz, dolor de garganta o sequedad en la garganta. Cuando las cuerdas vocales se inflaman, la voz suena diferente. La laringitis puede ser temporal (aguda) o de larga duracin (crnica). [place] [employer] de los casos de laringitis aguda mejoran con [place]. La laringitis crnica es la laringitis que dura ms de tres semanas. CUIDADOS EN EL HOGAR  Beba suficiente lquido para mantener el pis (orina) claro o de color amarillo plido.  Inhale aire hmedo. Use un humidificador si vive en un clima seco.  Tome los medicamentos solamente como se lo haya indicado el mdico.  No fume cigarrillos ni cigarrillos electrnicos. Si necesita ayuda para dejar de fumar, consulte al mdico.  Hable lo menos posible. Evite tambin susurrar ya que puede provocar esfuerzo vocal.  Alla German en vez de hablar. Hgalo hasta que su voz vuelva a la normalidad. SOLICITE AYUDA SI:  Tiene fiebre.  El dolor [place].  Presenta dificultad para tragar. SOLICITE AYUDA DE INMEDIATO SI:  Tose y escupe sangre.  Tiene dificultad para respirar. Esta informacin no tiene Marine scientist el consejo del mdico. Asegrese de hacerle al mdico cualquier pregunta que tenga. Document Released: 10/30/2011 Document Revised: 12/01/2014 Document Reviewed: 04/25/2014 Elsevier Interactive Patient Education  2017 [employer].

## 2017-02-17 ENCOUNTER — Telehealth: Payer: Self-pay | Admitting: Nurse Practitioner

## 2017-02-17 LAB — CA 125: CA 125: 10.5 U/mL (ref 0.0–38.1)

## 2017-02-17 NOTE — Telephone Encounter (Signed)
Patient aware that she will call back Thursday morning if not feeling better.

## 2017-02-25 ENCOUNTER — Telehealth: Payer: Self-pay | Admitting: Nurse Practitioner

## 2017-02-25 MED ORDER — AZITHROMYCIN 250 MG PO TABS: ORAL_TABLET | ORAL | 0 refills | Status: DC

## 2017-02-25 NOTE — Telephone Encounter (Signed)
Aware. 

## 2017-02-25 NOTE — Telephone Encounter (Signed)
What symptoms do you have?laryngitis, cough  How long have you been sick? Since last week  Have you been seen for this problem? Seen by MMM last week has tried taking muccinex and still coughing  If your provider decides to give you a prescription, which pharmacy would you like for it to be sent to? [facility] [place]   Patient informed that this information will be sent to the clinical staff for review and that they should receive a follow up call.

## 2017-02-25 NOTE — Telephone Encounter (Signed)
Covering PCP. Patient seen MMM 02/16/17. Please advise and send back to the pools.

## 2017-02-25 NOTE — Telephone Encounter (Signed)
Zpack sent to [facility]

## 2017-03-02 ENCOUNTER — Telehealth: Payer: Self-pay | Admitting: Nurse Practitioner

## 2017-03-02 NOTE — Telephone Encounter (Signed)
Probably allergy related- wll do chest xray when come sin for PAP- try otc allergy mes and se if helps-

## 2017-03-03 NOTE — Telephone Encounter (Signed)
Pt aware of recommendation °

## 2017-03-05 ENCOUNTER — Encounter: Payer: Self-pay | Admitting: Nurse Practitioner

## 2017-03-05 ENCOUNTER — Ambulatory Visit (INDEPENDENT_AMBULATORY_CARE_PROVIDER_SITE_OTHER): Payer: BLUE CROSS/BLUE SHIELD | Admitting: Nurse Practitioner

## 2017-03-05 ENCOUNTER — Ambulatory Visit (INDEPENDENT_AMBULATORY_CARE_PROVIDER_SITE_OTHER): Payer: BLUE CROSS/BLUE SHIELD

## 2017-03-05 VITALS — BP 129/81 | HR 80 | Temp 97.5°F | Ht 66.0 in | Wt 203.0 lb

## 2017-03-05 DIAGNOSIS — R05 Cough: Secondary | ICD-10-CM

## 2017-03-05 DIAGNOSIS — E785 Hyperlipidemia, unspecified: Secondary | ICD-10-CM

## 2017-03-05 DIAGNOSIS — Z1159 Encounter for screening for other viral diseases: Secondary | ICD-10-CM

## 2017-03-05 DIAGNOSIS — Z1231 Encounter for screening mammogram for malignant neoplasm of breast: Secondary | ICD-10-CM

## 2017-03-05 DIAGNOSIS — Z01419 Encounter for gynecological examination (general) (routine) without abnormal findings: Secondary | ICD-10-CM

## 2017-03-05 DIAGNOSIS — Z23 Encounter for immunization: Secondary | ICD-10-CM

## 2017-03-05 DIAGNOSIS — Z0000 Encounter for general adult medical examination without abnormal findings: Secondary | ICD-10-CM

## 2017-03-05 DIAGNOSIS — F3342 Major depressive disorder, recurrent, in full remission: Secondary | ICD-10-CM

## 2017-03-05 DIAGNOSIS — Z6832 Body mass index (BMI) 32.0-32.9, adult: Secondary | ICD-10-CM | POA: Diagnosis not present

## 2017-03-05 DIAGNOSIS — J370 Chronic laryngitis: Secondary | ICD-10-CM | POA: Diagnosis not present

## 2017-03-05 DIAGNOSIS — Z6833 Body mass index (BMI) 33.0-33.9, adult: Secondary | ICD-10-CM | POA: Insufficient documentation

## 2017-03-05 DIAGNOSIS — R053 Chronic cough: Secondary | ICD-10-CM

## 2017-03-05 NOTE — Progress Notes (Signed)
Subjective:    Patient ID: Fanny Skates, female    DOB: [birth year], 66 y.o.   MRN: [id number]  HPI  Karys Meckley is here today for follow up of chronic medical problem.  Outpatient Encounter Prescriptions as of 03/05/2017  Medication Sig  . PARoxetine (PAXIL-CR) 37.5 MG 24 hr tablet Take 1 tablet (37.5 mg total) by mouth daily.  . valACYclovir (VALTREX) 1000 MG tablet Take 1 tablet (1,000 mg total) by mouth 3 (three) times daily.     1. Recurrent major depressive disorder, in full remission ([place])  Current;y under control  2. Hyperlipidemia with target LDL less than 100  Does not watch diet- refuses statin  3. BMI 32.0-32.9,adult  No recent weight gain or weight loss    New complaints: C/O laryngitis- was given antibiotic and it helped but has returned- wants chest x ray because eof cough.     Review of Systems  Constitutional: Negative for chills and fever.  HENT: Positive for congestion.   Respiratory: Positive for cough.   Cardiovascular: Negative.   Gastrointestinal: Negative.   Genitourinary: Negative.   Neurological: Negative.   Psychiatric/Behavioral: Negative.   All other systems reviewed and are negative.      Objective:   Physical Exam  Constitutional: She is oriented to person, place, and time. She appears well-developed and well-nourished.  HENT:  Head: Normocephalic.  Right Ear: Hearing, tympanic membrane, external ear and ear canal normal.  Left Ear: Hearing, tympanic membrane, external ear and ear canal normal.  Nose: Nose normal.  Mouth/Throat: Uvula is midline and oropharynx is clear and moist.  Voice raspy  Eyes: Conjunctivae and EOM are normal. Pupils are equal, round, and reactive to light.  Neck: Trachea normal, normal range of motion and full passive range of motion without pain. Neck supple. No JVD present. Carotid bruit is not present. No thyroid mass and no thyromegaly present.  Cardiovascular: Normal rate, regular rhythm, normal heart  sounds and intact distal pulses.  Exam reveals no gallop and no friction rub.   No murmur heard. Pulmonary/Chest: Effort normal and breath sounds normal. Right breast exhibits no inverted nipple, no mass, no nipple discharge, no skin change and no tenderness. Left breast exhibits no inverted nipple, no mass, no nipple discharge, no skin change and no tenderness.  Abdominal: Soft. Bowel sounds are normal. She exhibits no distension and no mass. There is no tenderness.  Genitourinary: Vagina normal and uterus normal. No breast swelling, tenderness, discharge or bleeding.  Genitourinary Comments: bimanual exam-No adnexal masses or tenderness. Cervix nonparous and pink  Musculoskeletal: Normal range of motion.  Lymphadenopathy:    She has no cervical adenopathy.  Neurological: She is alert and oriented to person, place, and time. She has normal reflexes.  Skin: Skin is warm and dry.  Psychiatric: She has a normal mood and affect. Her behavior is normal. Judgment and thought content normal.     BP 129/81   Pulse 80   Temp 97.5 F (36.4 C) (Oral)   Ht _0  (1.676 m)   Wt 203 lb (92.1 kg)   BMI 32.77 kg/m       Assessment & Plan:  1. Annual physical exam - CBC with Differential/Platelet - Thyroid Panel With TSH  2. Gynecologic exam normal - IGP, Aptima HPV, rfx 16/18,45  3. Recurrent major depressive disorder, in full remission ([place]) Stress management Continue paxil as rx  4. Hyperlipidemia with target LDL less than 100 Low fat diet - CMP14+EGFR - Lipid panel  5. BMI 32.0-32.9,adult Discussed diet and exercise for person with BMI >25 Will recheck weight in 3-6 months  6. Cough, persistent  - DG Chest 2 View  7. Laryngitis - ENT referral  Labs pending Health maintenance reviewed Diet and exercise encouraged Continue all meds Follow up  In 6 months   [place], FNP

## 2017-03-05 NOTE — Addendum Note (Signed)
Addended by: Rolena Infante on: 03/05/2017 12:17 PM   Modules accepted: Orders

## 2017-03-06 LAB — CBC WITH DIFFERENTIAL/PLATELET
BASOS ABS: 0 10*3/uL (ref 0.0–0.2)
Basos: 1 %
EOS (ABSOLUTE): 0.1 10*3/uL (ref 0.0–0.4)
Eos: 1 %
HEMATOCRIT: 40.4 % (ref 34.0–46.6)
Hemoglobin: 13.4 g/dL (ref 11.1–15.9)
Immature Grans (Abs): 0 10*3/uL (ref 0.0–0.1)
Immature Granulocytes: 0 %
LYMPHS ABS: 1.7 10*3/uL (ref 0.7–3.1)
Lymphs: 23 %
MCH: 30.8 pg (ref 26.6–33.0)
MCHC: 33.2 g/dL (ref 31.5–35.7)
MCV: 93 fL (ref 79–97)
MONOS ABS: 0.4 10*3/uL (ref 0.1–0.9)
Monocytes: 6 %
Neutrophils Absolute: 5.1 10*3/uL (ref 1.4–7.0)
Neutrophils: 69 %
Platelets: 252 10*3/uL (ref 150–379)
RBC: 4.35 x10E6/uL (ref 3.77–5.28)
RDW: 13 % (ref 12.3–15.4)
WBC: 7.3 10*3/uL (ref 3.4–10.8)

## 2017-03-06 LAB — THYROID PANEL WITH TSH
Free Thyroxine Index: 1.8 (ref 1.2–4.9)
T3 Uptake Ratio: 26 % (ref 24–39)
T4, Total: 7.1 ug/dL (ref 4.5–12.0)
TSH: 1.5 u[IU]/mL (ref 0.450–4.500)

## 2017-03-06 LAB — LIPID PANEL
CHOL/HDL RATIO: 5.1 ratio — AB (ref 0.0–4.4)
Cholesterol, Total: 259 mg/dL — ABNORMAL HIGH (ref 100–199)
HDL: 51 mg/dL (ref >39)
LDL Calculated: 187 mg/dL — ABNORMAL HIGH (ref 0–99)
Triglycerides: 106 mg/dL (ref 0–149)
VLDL Cholesterol Cal: 21 mg/dL (ref 5–40)

## 2017-03-06 LAB — CMP14+EGFR
ALK PHOS: 83 IU/L (ref 39–117)
ALT: 11 IU/L (ref 0–32)
AST: 14 IU/L (ref 0–40)
Albumin/Globulin Ratio: 1.8 (ref 1.2–2.2)
Albumin: 4.4 g/dL (ref 3.6–4.8)
BUN/Creatinine Ratio: 19 (ref 12–28)
BUN: 15 mg/dL (ref 8–27)
Bilirubin Total: 0.3 mg/dL (ref 0.0–1.2)
CO2: 26 mmol/L (ref 18–29)
CREATININE: 0.8 mg/dL (ref 0.57–1.00)
Calcium: 9.4 mg/dL (ref 8.7–10.3)
Chloride: 103 mmol/L (ref 96–106)
GFR calc Af Amer: 90 mL/min/{1.73_m2} (ref >59)
GFR calc non Af Amer: 78 mL/min/{1.73_m2} (ref >59)
GLOBULIN, TOTAL: 2.5 g/dL (ref 1.5–4.5)
GLUCOSE: 95 mg/dL (ref 65–99)
Potassium: 4.4 mmol/L (ref 3.5–5.2)
SODIUM: 142 mmol/L (ref 134–144)
Total Protein: 6.9 g/dL (ref 6.0–8.5)

## 2017-03-06 LAB — HEPATITIS C ANTIBODY: Hep C Virus Ab: <0.1 s/co ratio (ref 0.0–0.9)

## 2017-03-08 LAB — IGP, APTIMA HPV, RFX 16/18,45
HPV APTIMA: NEGATIVE
PAP Smear Comment: 0

## 2017-03-23 ENCOUNTER — Telehealth: Payer: Self-pay | Admitting: Nurse Practitioner

## 2017-03-24 NOTE — Telephone Encounter (Signed)
Patient aware of lab results.  Cholesterol was elevated. Would you like for patient to start taking any cholesterol medication?  Patient states that short term memory is getting worse.

## 2017-03-24 NOTE — Telephone Encounter (Signed)
Well just know that cholesterol being that high can lead to stroke or heart attack

## 2017-03-25 NOTE — Telephone Encounter (Signed)
Patient would be interested in taking a medication for cholesterol if you think it would help.  Please send to pharmacy if you are agreeable.

## 2017-03-26 MED ORDER — ATORVASTATIN CALCIUM 40 MG PO TABS
40.0000 mg | ORAL_TABLET | Freq: Every day | ORAL | 1 refills | Status: DC
Start: 2017-03-26 — End: 2017-06-04

## 2017-03-26 NOTE — Addendum Note (Signed)
Addended by: Chevis Pretty on: 03/26/2017 03:14 PM   Modules accepted: Orders

## 2017-03-30 DIAGNOSIS — Z7289 Other problems related to lifestyle: Secondary | ICD-10-CM | POA: Diagnosis not present

## 2017-03-30 DIAGNOSIS — R490 Dysphonia: Secondary | ICD-10-CM | POA: Diagnosis not present

## 2017-04-09 ENCOUNTER — Ambulatory Visit (INDEPENDENT_AMBULATORY_CARE_PROVIDER_SITE_OTHER): Payer: BLUE CROSS/BLUE SHIELD | Admitting: Nurse Practitioner

## 2017-04-09 ENCOUNTER — Encounter: Payer: Self-pay | Admitting: Nurse Practitioner

## 2017-04-09 VITALS — BP 130/74 | HR 80 | Temp 96.8°F | Ht 66.0 in | Wt 206.0 lb

## 2017-04-09 DIAGNOSIS — R5383 Other fatigue: Secondary | ICD-10-CM | POA: Diagnosis not present

## 2017-04-09 DIAGNOSIS — N3000 Acute cystitis without hematuria: Secondary | ICD-10-CM

## 2017-04-09 DIAGNOSIS — R200 Anesthesia of skin: Secondary | ICD-10-CM | POA: Diagnosis not present

## 2017-04-09 DIAGNOSIS — R300 Dysuria: Secondary | ICD-10-CM

## 2017-04-09 LAB — URINALYSIS, COMPLETE
Bilirubin, UA: NEGATIVE
GLUCOSE, UA: NEGATIVE
Ketones, UA: NEGATIVE
NITRITE UA: NEGATIVE
Protein, UA: NEGATIVE
Specific Gravity, UA: 1.02 (ref 1.005–1.030)
Urobilinogen, Ur: 0.2 mg/dL (ref 0.2–1.0)
pH, UA: 5 (ref 5.0–7.5)

## 2017-04-09 LAB — MICROSCOPIC EXAMINATION
Epithelial Cells (non renal): >10 /hpf — AB (ref 0–10)
Renal Epithel, UA: NONE SEEN /hpf

## 2017-04-09 MED ORDER — CIPROFLOXACIN HCL 500 MG PO TABS: 500.0000 mg | ORAL_TABLET | Freq: Two times a day (BID) | ORAL | 0 refills | Status: DC

## 2017-04-09 NOTE — Progress Notes (Signed)
   Subjective:    Patient ID: Mikayla Knight, female    DOB: 1951/07/20, 66 y.o.   MRN: [id number]  HPI  Patient comes in by herself today c/o just not feeling like herelf. For the last 2 months she has felt "foggy headed" very forgetful, looses her train of thought. Trouble concentrating on job. She did have an episode about 1 omnth ago where left arm became very "heavy feeling" lasted about 10 minutes then went away. Has occasional twinges in left cheek that not sure cause of.   Review of Systems  Constitutional: Negative for activity change, appetite change, chills and fever.  HENT: Negative.   Eyes:       Slight visual changes mainly with reading  Respiratory: Negative.   Cardiovascular: Negative for chest pain, palpitations and leg swelling.  Gastrointestinal: Negative.   Genitourinary: Positive for urgency. Negative for dysuria and frequency.  Neurological: Negative.   Psychiatric/Behavioral: Negative.        Objective:   Physical Exam  Constitutional: She is oriented to person, place, and time. She appears well-developed and well-nourished. No distress.  Cardiovascular: Normal rate, regular rhythm and normal heart sounds.   Pulmonary/Chest: Effort normal and breath sounds normal.  Neurological: She is alert and oriented to person, place, and time. She has normal reflexes. No cranial nerve deficit.  Skin: Skin is warm.  Psychiatric: She has a normal mood and affect. Her behavior is normal. Judgment and thought content normal.   BP 130/74   Pulse 80   Temp (!) 96.8 F (36 C) (Oral)   Ht 5\' 6"  (1.676 m)   Wt 206 lb (93.4 kg)   BMI 33.25 kg/m   UA- early UTI     Assessment & Plan:  1. Dysuria - Urinalysis, Complete  2. Left arm numbness - EKG 12-Lead  3. Other fatigue Labs pending - CBC with Differential/Platelet - Lyme Ab/Western Blot Reflex - Thyroid Panel With TSH - Vitamin B12  4. Acute cystitis without hematuria Take medication as prescribe Cotton  underwear Take shower not bath Cranberry juice, yogurt Force fluids AZO over the counter X2 days Culture pending RTO prn  Meds ordered this encounter  Medications  . ciprofloxacin (CIPRO) 500 MG tablet    Sig: Take 1 tablet (500 mg total) by mouth 2 (two) times daily.    Dispense:  6 tablet    Refill:  0    Order Specific Question:   Supervising Provider    Answer:   Eustaquio Maize [[id number]]    [name], FNP  - ciprofloxacin (CIPRO) 500 MG tablet; Take 1 tablet (500 mg total) by mouth 2 (two) times daily.  Dispense: 6 tablet; Refill: 0

## 2017-04-09 NOTE — Patient Instructions (Signed)

## 2017-04-10 LAB — CBC WITH DIFFERENTIAL/PLATELET
BASOS: 0 %
Basophils Absolute: 0 10*3/uL (ref 0.0–0.2)
EOS (ABSOLUTE): 0.2 10*3/uL (ref 0.0–0.4)
EOS: 3 %
HEMATOCRIT: 40.7 % (ref 34.0–46.6)
Hemoglobin: 13.6 g/dL (ref 11.1–15.9)
IMMATURE GRANS (ABS): 0 10*3/uL (ref 0.0–0.1)
IMMATURE GRANULOCYTES: 0 %
LYMPHS: 26 %
Lymphocytes Absolute: 1.8 10*3/uL (ref 0.7–3.1)
MCH: 31.1 pg (ref 26.6–33.0)
MCHC: 33.4 g/dL (ref 31.5–35.7)
MCV: 93 fL (ref 79–97)
MONOS ABS: 0.4 10*3/uL (ref 0.1–0.9)
Monocytes: 6 %
NEUTROS ABS: 4.3 10*3/uL (ref 1.4–7.0)
NEUTROS PCT: 65 %
PLATELETS: 232 10*3/uL (ref 150–379)
RBC: 4.38 x10E6/uL (ref 3.77–5.28)
RDW: 13.2 % (ref 12.3–15.4)
WBC: 6.7 10*3/uL (ref 3.4–10.8)

## 2017-04-10 LAB — THYROID PANEL WITH TSH
FREE THYROXINE INDEX: 1.8 (ref 1.2–4.9)
T3 UPTAKE RATIO: 22 % — AB (ref 24–39)
T4, Total: 8.3 ug/dL (ref 4.5–12.0)
TSH: 1.84 u[IU]/mL (ref 0.450–4.500)

## 2017-04-10 LAB — VITAMIN B12: Vitamin B-12: 500 pg/mL (ref 232–1245)

## 2017-04-10 LAB — LYME AB/WESTERN BLOT REFLEX: Lyme IgG/IgM Ab: <0.91 {ISR} (ref 0.00–0.90)

## 2017-04-11 LAB — URINE CULTURE

## 2017-04-15 ENCOUNTER — Telehealth: Payer: Self-pay | Admitting: Nurse Practitioner

## 2017-04-15 NOTE — Telephone Encounter (Signed)
Pt aware of results 

## 2017-05-07 ENCOUNTER — Other Ambulatory Visit: Payer: Self-pay | Admitting: Nurse Practitioner

## 2017-06-04 ENCOUNTER — Encounter: Payer: Self-pay | Admitting: Nurse Practitioner

## 2017-06-04 ENCOUNTER — Ambulatory Visit (INDEPENDENT_AMBULATORY_CARE_PROVIDER_SITE_OTHER): Payer: BLUE CROSS/BLUE SHIELD | Admitting: Nurse Practitioner

## 2017-06-04 ENCOUNTER — Ambulatory Visit: Payer: BLUE CROSS/BLUE SHIELD | Admitting: Nurse Practitioner

## 2017-06-04 VITALS — BP 135/85 | HR 80 | Temp 97.4°F | Ht 66.0 in | Wt 208.0 lb

## 2017-06-04 DIAGNOSIS — Z6832 Body mass index (BMI) 32.0-32.9, adult: Secondary | ICD-10-CM | POA: Diagnosis not present

## 2017-06-04 DIAGNOSIS — R5383 Other fatigue: Secondary | ICD-10-CM

## 2017-06-04 DIAGNOSIS — F3342 Major depressive disorder, recurrent, in full remission: Secondary | ICD-10-CM

## 2017-06-04 DIAGNOSIS — E785 Hyperlipidemia, unspecified: Secondary | ICD-10-CM | POA: Diagnosis not present

## 2017-06-04 DIAGNOSIS — B009 Herpesviral infection, unspecified: Secondary | ICD-10-CM

## 2017-06-04 DIAGNOSIS — F329 Major depressive disorder, single episode, unspecified: Secondary | ICD-10-CM | POA: Diagnosis not present

## 2017-06-04 DIAGNOSIS — F32A Depression, unspecified: Secondary | ICD-10-CM

## 2017-06-04 MED ORDER — PAROXETINE HCL ER 37.5 MG PO TB24: 37.5000 mg | ORAL_TABLET | Freq: Every day | ORAL | 5 refills | Status: DC

## 2017-06-04 MED ORDER — VALACYCLOVIR HCL 1 G PO TABS: 1000.0000 mg | ORAL_TABLET | Freq: Three times a day (TID) | ORAL | 1 refills | Status: DC

## 2017-06-04 MED ORDER — ATORVASTATIN CALCIUM 40 MG PO TABS: 40.0000 mg | ORAL_TABLET | Freq: Every day | ORAL | 1 refills | Status: DC

## 2017-06-04 NOTE — Progress Notes (Signed)
Subjective:    Patient ID: Mikayla Knight, female    DOB: [birth year], 66 y.o.   MRN: [id number]  HPI  Mikayla Knight is here today for follow up of chronic medical problem.  Outpatient Encounter Prescriptions as of 06/04/2017  Medication Sig  . atorvastatin (LIPITOR) 40 MG tablet Take 1 tablet (40 mg total) by mouth daily.  Marland Kitchen PARoxetine (PAXIL-CR) 37.5 MG 24 hr tablet Take 1 tablet (37.5 mg total) by mouth daily.  . valACYclovir (VALTREX) 1000 MG tablet TAKE ONE TABLET BY MOUTH THREE TIMES DAILY   No facility-administered encounter medications on file as of 06/04/2017.     1. Hyperlipidemia with target LDL less than 100  Does not watch diet  2. Recurrent major depressive disorder, in full remission [facility])  Depression screen [facility] 2/9 04/09/2017 04/09/2017 03/05/2017 02/16/2017 02/16/2017  Decreased Interest 0 0 0 0 0  Down, Depressed, Hopeless 0 0 0 0 0  PHQ - 2 Score 0 0 0 0 0     3. BMI 32.0-32.9,adult  No recent weight changes  4.      HSV1          Takes valtrex as needed. No recent outbreaks  New complaints: Patients sister has been diagnosed with some type of lymphoma and she is conviced That she has some type of cancer even though she has no symptoms of anything. She only complains of fatigue. Patient has history of sleep apnea and use to wear CPAP - has not worn in years.    Review of Systems  Constitutional: Negative for activity change and appetite change.  HENT: Negative.   Eyes: Negative for pain.  Respiratory: Negative for shortness of breath.   Cardiovascular: Negative for chest pain, palpitations and leg swelling.  Gastrointestinal: Negative for abdominal pain.  Endocrine: Negative for polydipsia.  Genitourinary: Negative.   Skin: Negative for rash.  Neurological: Negative for dizziness, weakness and headaches.  Hematological: Does not bruise/bleed easily.  Psychiatric/Behavioral: Negative.   All other systems reviewed and are negative.      Objective:   Physical Exam  Constitutional: She is oriented to person, place, and time. She appears well-developed and well-nourished.  HENT:  Nose: Nose normal.  Mouth/Throat: Oropharynx is clear and moist.  Eyes: EOM are normal.  Neck: Trachea normal, normal range of motion and full passive range of motion without pain. Neck supple. No JVD present. Carotid bruit is not present. No thyromegaly present.  Cardiovascular: Normal rate, regular rhythm, normal heart sounds and intact distal pulses.  Exam reveals no gallop and no friction rub.   No murmur heard. Pulmonary/Chest: Effort normal and breath sounds normal.  Abdominal: Soft. Bowel sounds are normal. She exhibits no distension and no mass. There is no tenderness.  Musculoskeletal: Normal range of motion.  Lymphadenopathy:    She has no cervical adenopathy.  Neurological: She is alert and oriented to person, place, and time. She has normal reflexes.  Skin: Skin is warm and dry.  Psychiatric: She has a normal mood and affect. Her behavior is normal. Judgment and thought content normal.    BP 135/85   Pulse 80   Temp (!) 97.4 F (36.3 C) (Oral)   Ht _0  (1.676 m)   Wt 208 lb (94.3 kg)   BMI 33.57 kg/m      Assessment & Plan:  1. Hyperlipidemia with target LDL less than 100 Low fat  - CMP14+EGFR - Lipid panel - atorvastatin (LIPITOR) 40 MG tablet; Take 1 tablet (  40 mg total) by mouth daily.  Dispense: 90 tablet; Refill: 1  2. Recurrent major depressive disorder, in full remission [facility]) Stress management  3. BMI 32.0-32.9,adult Discussed diet and exercise for person with BMI >25 Will recheck weight in 3-6 months   4. HSV-1 (herpes simplex virus 1) infection - valACYclovir (VALTREX) 1000 MG tablet; Take 1 tablet (1,000 mg total) by mouth 3 (three) times daily.  Dispense: 30 tablet; Refill: 1  5. Fatigue, unspecified type Labs pending - Anemia Profile B - Thyroid Panel With TSH - Ambulatory referral to Sleep Studies  6.  Depression, unspecified depression type Stress management - PARoxetine (PAXIL-CR) 37.5 MG 24 hr tablet; Take 1 tablet (37.5 mg total) by mouth daily.  Dispense: 30 tablet; Refill: 5    Labs pending Health maintenance reviewed Diet and exercise encouraged Continue all meds Follow up  In 3 month   [place], FNP

## 2017-06-04 NOTE — Patient Instructions (Signed)

## 2017-06-05 LAB — ANEMIA PROFILE B
BASOS: 0 %
Basophils Absolute: 0 10*3/uL (ref 0.0–0.2)
EOS (ABSOLUTE): 0.2 10*3/uL (ref 0.0–0.4)
Eos: 3 %
FERRITIN: 157 ng/mL — AB (ref 15–150)
Folate: 8.9 ng/mL (ref >3.0)
HEMATOCRIT: 40.3 % (ref 34.0–46.6)
Hemoglobin: 13.6 g/dL (ref 11.1–15.9)
Immature Grans (Abs): 0 10*3/uL (ref 0.0–0.1)
Immature Granulocytes: 0 %
Iron Saturation: 25 % (ref 15–55)
Iron: 63 ug/dL (ref 27–139)
LYMPHS ABS: 1.9 10*3/uL (ref 0.7–3.1)
Lymphs: 26 %
MCH: 31.9 pg (ref 26.6–33.0)
MCHC: 33.7 g/dL (ref 31.5–35.7)
MCV: 94 fL (ref 79–97)
MONOS ABS: 0.5 10*3/uL (ref 0.1–0.9)
Monocytes: 7 %
NEUTROS ABS: 4.6 10*3/uL (ref 1.4–7.0)
Neutrophils: 64 %
Platelets: 248 10*3/uL (ref 150–379)
RBC: 4.27 x10E6/uL (ref 3.77–5.28)
RDW: 13.5 % (ref 12.3–15.4)
Retic Ct Pct: 1.5 % (ref 0.6–2.6)
Total Iron Binding Capacity: 255 ug/dL (ref 250–450)
UIBC: 192 ug/dL (ref 118–369)
VITAMIN B 12: 431 pg/mL (ref 232–1245)
WBC: 7.2 10*3/uL (ref 3.4–10.8)

## 2017-06-05 LAB — LIPID PANEL
CHOLESTEROL TOTAL: 290 mg/dL — AB (ref 100–199)
Chol/HDL Ratio: 6.7 ratio — ABNORMAL HIGH (ref 0.0–4.4)
HDL: 43 mg/dL (ref >39)
LDL Calculated: 217 mg/dL — ABNORMAL HIGH (ref 0–99)
TRIGLYCERIDES: 150 mg/dL — AB (ref 0–149)
VLDL Cholesterol Cal: 30 mg/dL (ref 5–40)

## 2017-06-05 LAB — CMP14+EGFR
A/G RATIO: 1.7 (ref 1.2–2.2)
ALT: 21 IU/L (ref 0–32)
AST: 23 IU/L (ref 0–40)
Albumin: 4.4 g/dL (ref 3.6–4.8)
Alkaline Phosphatase: 105 IU/L (ref 39–117)
BILIRUBIN TOTAL: 0.3 mg/dL (ref 0.0–1.2)
BUN/Creatinine Ratio: 19 (ref 12–28)
BUN: 15 mg/dL (ref 8–27)
CHLORIDE: 104 mmol/L (ref 96–106)
CO2: 23 mmol/L (ref 20–29)
Calcium: 9.6 mg/dL (ref 8.7–10.3)
Creatinine, Ser: 0.78 mg/dL (ref 0.57–1.00)
GFR calc Af Amer: 92 mL/min/{1.73_m2} (ref >59)
GFR calc non Af Amer: 80 mL/min/{1.73_m2} (ref >59)
GLUCOSE: 87 mg/dL (ref 65–99)
Globulin, Total: 2.6 g/dL (ref 1.5–4.5)
POTASSIUM: 5 mmol/L (ref 3.5–5.2)
Sodium: 143 mmol/L (ref 134–144)
Total Protein: 7 g/dL (ref 6.0–8.5)

## 2017-06-05 LAB — THYROID PANEL WITH TSH
Free Thyroxine Index: 1.9 (ref 1.2–4.9)
T3 Uptake Ratio: 22 % — ABNORMAL LOW (ref 24–39)
T4, Total: 8.6 ug/dL (ref 4.5–12.0)
TSH: 1.98 u[IU]/mL (ref 0.450–4.500)

## 2017-07-07 ENCOUNTER — Encounter: Payer: Self-pay | Admitting: Neurology

## 2017-07-07 ENCOUNTER — Ambulatory Visit (INDEPENDENT_AMBULATORY_CARE_PROVIDER_SITE_OTHER): Payer: BLUE CROSS/BLUE SHIELD | Admitting: Neurology

## 2017-07-07 VITALS — BP 122/78 | HR 79 | Ht 66.0 in | Wt 207.0 lb

## 2017-07-07 DIAGNOSIS — G4710 Hypersomnia, unspecified: Secondary | ICD-10-CM | POA: Diagnosis not present

## 2017-07-07 DIAGNOSIS — M5386 Other specified dorsopathies, lumbar region: Secondary | ICD-10-CM

## 2017-07-07 DIAGNOSIS — G4730 Sleep apnea, unspecified: Secondary | ICD-10-CM

## 2017-07-07 DIAGNOSIS — R0683 Snoring: Secondary | ICD-10-CM

## 2017-07-07 DIAGNOSIS — F515 Nightmare disorder: Secondary | ICD-10-CM | POA: Diagnosis not present

## 2017-07-07 DIAGNOSIS — G4709 Other insomnia: Secondary | ICD-10-CM | POA: Diagnosis not present

## 2017-07-07 DIAGNOSIS — M539 Dorsopathy, unspecified: Secondary | ICD-10-CM

## 2017-07-07 NOTE — Progress Notes (Signed)
SLEEP MEDICINE CLINIC   Provider:  Larey Seat, M D  Primary Care Physician:  Chevis Pretty, FNP   Referring Provider: Chevis Pretty, *    Chief Complaint  Patient presents with  . New Patient (Initial Visit)     pt alone, pt had a sleep study about 15 years ago, diagosed with sleep apnea at that time placed on CPAP. pt used it for a while and then stopped. pt family states she gasp for air, snores, restless in sleep. pt complains of never feeling rested.     HPI:  Mikayla Knight is a 66 y.o. female , seen here as in a referral from [place] for a sleep evaluation.  Mikayla Knight, who likes to be addressed as "Mikayla Knight", presents today with a history of sleep apnea. She also has a history of hyperlipidemia, recurrent major depression, is overweight and daytime fatigue. An anemia panel, thyroid hormone levels were already obtained with her primary care and has not revealed any reason for her fatigue. She was referred for a sleep study.  Mikayla Knight had been tested before for sleep apnea- she believes about 15 years ago, was diagnosed with apnea but didn't follow through to obtain therapy.  She has developed a sleep pattern and that no longer provides refreshing and restorative sleep for her, waking up at 4 in the morning without the need to wake up and often does trouble to reinitiate sleep afterwards, she has coughing during the night which interrupts sleep, she finds herself tossing and turning, she does experience aches and pains during the night, making it very hard to find a comfortable sleep position. Most recently she developed what was interpreted as a right sciatica.  Chief complaint according to patient : "I cannot get good sleep"  Sleep habits are as follows: Her bedtime is around 10 PM, she goes to sleep rather promptly. There is some witnesses to her sleep, but lives alone .  A friend she shared a room with has noted her to snore loudly. The bedroom is  cool quiet and dark. She usually starts on 2 pillows with a slight elevation of the head. She has a fan in the background. She does never sleep prone, sleeps on her side or on her back. She spent the last [place] beach in the presence of her daughter and sister. Her family woke her up because she was yelling and talking in her sleep, rather actively. She did not remember the dream. She has woken herself up dreaming vividly.  She does not report any nocturia, she wakes up spontaneously in the morning hours. After this spontaneous arousals the rest of the sleep time is fragmented, restless and not restorative.   Sleep medical history and family sleep history: There has been no family member diagnosed with apnea, the patient has no history of ear nose and throat surgery, septoplasty, tonsillectomy and adenoidectomy's etc.  Social history:  3 adult children, divorced. 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 She is mainly seated throughout the day, she does have to multitask and find this hard of these days.  She finds it hard to concentrate. She drinks 2 cups of coffee each morning, sometimes she will have another drink from [employer] during the morning hours. She does not use tobacco, has never been a smoker. She will drink alcohol in social situations, she likes to drink one glass of wine in the evenings.  Review of  Systems: Out of a complete 14 system review, the patient complains of only the following symptoms, and all other reviewed systems are negative.  Fatigue, blurred vision, snoring, easy bruising, joint pain, cramps, aching muscles, not enough sleep, decreased energy, short term memory loss and confusion. Epworth score 6 , Fatigue severity score 31 , depression score 1/ 15 .   Social History   Social History  . Marital status: Divorced    Spouse name: N/A  . Number of children: 3  . Years of education: N/A   Occupational History  .   [employer]   Social History Main Topics  . Smoking status: Never Smoker  . Smokeless tobacco: Never Used  . Alcohol use No  . Drug use: No  . Sexual activity: Not on file   Other Topics Concern  . Not on file   Social History Narrative  . No narrative on file    Family History  Problem Relation Age of Onset  . Ovarian cancer Mother     Past Medical History:  Diagnosis Date  . Depression   . Hyperlipidemia   . Internal hemorrhoids     Past Surgical History:  Procedure Laterality Date  . COLONOSCOPY  04-01-2004    Current Outpatient Prescriptions  Medication Sig Dispense Refill  . atorvastatin (LIPITOR) 40 MG tablet Take 1 tablet (40 mg total) by mouth daily. 90 tablet 1  . PARoxetine (PAXIL-CR) 37.5 MG 24 hr tablet Take 1 tablet (37.5 mg total) by mouth daily. 30 tablet 5  . valACYclovir (VALTREX) 1000 MG tablet Take 1 tablet (1,000 mg total) by mouth 3 (three) times daily. 30 tablet 1   No current facility-administered medications for this visit.     Allergies as of 07/07/2017  . (No Known Allergies)    Vitals: BP 122/78   Pulse 79   Ht 5\' 6"  (1.676 m)   Wt 207 lb (93.9 kg)   BMI 33.41 kg/m  Last Weight:  Wt Readings from Last 1 Encounters:  07/07/17 207 lb (93.9 kg)   HEN:IDPO mass index is 33.41 kg/m.     Last Height:   Ht Readings from Last 1 Encounters:  07/07/17 5\' 6"  (1.676 m)    Physical exam:  General: The patient is awake, alert and appears not in acute distress. The patient is well groomed. Head: Normocephalic, atraumatic. Neck is supple. Mallampati 3- frequent sinusitis, laryngitis.  neck circumference:15. Nasal airflow patent , TMJ is  evident .  Retrognathia is seen.  Cardiovascular:  Regular rate and rhythm, without  murmurs or carotid bruit, and without distended neck veins. Respiratory: Lungs are clear to auscultation. Skin:  Without evidence of edema, or rash Trunk: BMI is . The patient's posture is  erect  Neurologic exam : The patient is awake and alert, oriented to place and time.   Memory subjective described as impaired/Memory testing not performed.  MOCA:No flowsheet data found. MMSE:No flowsheet data found. Attention span & concentration ability appears normal.  Speech is fluent,  without dysarthria, dysphonia or aphasia.  Mood and affect are appropriate.  Cranial nerves: Pupils are equal and briskly reactive to light. Funduscopic exam deferred. Extraocular movements in vertical and horizontal planes intact and without nystagmus.  Visual fields by finger perimetry are intact. Hearing to finger rub intact.   Facial sensation intact to fine touch.  Facial motor strength is symmetric and tongue and uvula move midline. Shoulder shrug was symmetrical.   Motor exam: Normal tone, muscle bulk and  symmetric strength in all extremities.  Sensory:  Fine touch, pinprick and vibration were  Normal. Right shoulder burning.  Right leg burning in the hamstring.   Coordination: Rapid alternating movements in the fingers/hands was normal. Finger-to-nose maneuver  normal without evidence of ataxia, dysmetria or tremor. Gait and station: Patient walks without assistive device and is able unassisted to climb up to the exam table. Strength within normal limits.  Stance is stable and normal.  Deep tendon reflexes: in the  upper and lower extremities are symmetric and intact. Babinski maneuver response is  downgoing.   Assessment:  After physical and neurologic examination, review of laboratory studies,  Personal review of imaging studies, reports of other /same  Imaging studies, results of polysomnography and / or neurophysiology testing and pre-existing records as far as provided in visit., my assessment is   1) problem #1 nonrestorative, non-refreshing sleep.   The patient is known to also sleep talk, sometimes yell out in her sleep. She had a positive sleep apnea evaluation 15 years ago. I will  reorder a sleep study for her, this time with a parasomnia montage, to be split at AHI 20. The patient also snores.  2) the patient reports pain in the right hamstring, feels unstable when bearing weight on the right leg. This has affected her ability to sleep restful as well. I will order a lower back MRI and nerve conduction study for.  3) daytime fatigue and short-term memory loss, I will evaluate the patient based on her sleep study results. There is a good chance that she is not getting the quality and length of sleep she needs to function cognitively at her best. A revisit may include a memory test.   The patient was advised of the nature of the diagnosed disorder , the treatment options and the  risks for general health and wellness arising from not treating the condition.   I spent more than 50 minutes of face to face time with the patient.  Greater than 50% of time was spent in counseling and coordination of care. We have discussed the diagnosis and differential and I answered the patient's questions.    Plan:  Treatment plan and additional workup :  PSG night with REM behaviour montage, SPLIT at AHI 20, if apnea is seen.  MRI lumbar,  NCV    Larey Seat, MD 7/74/1287, 8:67 AM  Certified in Neurology by ABPN Certified in [place] by [facility] [address], [place], [place] [address]

## 2017-07-07 NOTE — Patient Instructions (Signed)
Sciatica Sciatica is pain, numbness, weakness, or tingling along the path of the sciatic nerve. The sciatic nerve starts in the lower back and runs down the back of each leg. The nerve controls the muscles in the lower leg and in the back of the knee. It also provides feeling (sensation) to the back of the thigh, the lower leg, and the sole of the foot. Sciatica is a symptom of another medical condition that pinches or puts pressure on the sciatic nerve. Generally, sciatica only affects one side of the body. Sciatica usually goes away on its own or with treatment. In some cases, sciatica may keep coming back (recur). What are the causes? This condition is caused by pressure on the sciatic nerve, or pinching of the sciatic nerve. This may be the result of:  A disk in between the bones of the spine (vertebrae) bulging out too far (herniated disk).  Age-related changes in the spinal disks (degenerative disk disease).  A pain disorder that affects a muscle in the buttock (piriformis syndrome).  Extra bone growth (bone spur) near the sciatic nerve.  An injury or break (fracture) of the pelvis.  Pregnancy.  Tumor (rare). What increases the risk? The following factors may make you more likely to develop this condition:  Playing sports that place pressure or stress on the spine, such as football or weight lifting.  Having poor strength and flexibility.  A history of back injury.  A history of back surgery.  Sitting for long periods of time.  Doing activities that involve repetitive bending or lifting.  Obesity. What are the signs or symptoms? Symptoms can vary from mild to very severe, and they may include:  Any of these problems in the lower back, leg, hip, or buttock:  Mild tingling or dull aches.  Burning sensations.  Sharp pains.  Numbness in the back of the calf or the sole of the foot.  Leg weakness.  Severe back pain that makes movement difficult. These symptoms may  get worse when you cough, sneeze, or laugh, or when you sit or stand for long periods of time. Being overweight may also make symptoms worse. In some cases, symptoms may recur over time. How is this diagnosed? This condition may be diagnosed based on:  Your symptoms.  A physical exam. Your health care provider may ask you to do certain movements to check whether those movements trigger your symptoms.  You may have tests, including:  Blood tests.  X-rays.  MRI.  CT scan. How is this treated? In many cases, this condition improves on its own, without any treatment. However, treatment may include:  Reducing or modifying physical activity during periods of pain.  Exercising and stretching to strengthen your abdomen and improve the flexibility of your spine.  Icing and applying heat to the affected area.  Medicines that help:  To relieve pain and swelling.  To relax your muscles.  Injections of medicines that help to relieve pain, irritation, and inflammation around the sciatic nerve (steroids).  Surgery. Follow these instructions at home: Medicines   Take over-the-counter and prescription medicines only as told by your health care provider.  Do not drive or operate heavy machinery while taking prescription pain medicine. Managing pain   If directed, apply ice to the affected area.  Put ice in a plastic bag.  Place a towel between your skin and the bag.  Leave the ice on for 20 minutes, 2-3 times a day.  After icing, apply heat to the   affected area before you exercise or as often as told by your health care provider. Use the heat source that your health care provider recommends, such as a moist heat pack or a heating pad.  Place a towel between your skin and the heat source.  Leave the heat on for 20-30 minutes.  Remove the heat if your skin turns bright red. This is especially important if you are unable to feel pain, heat, or cold. You may have a greater risk of  getting burned. Activity   Return to your normal activities as told by your health care provider. Ask your health care provider what activities are safe for you.  Avoid activities that make your symptoms worse.  Take brief periods of rest throughout the day. Resting in a lying or standing position is usually better than sitting to rest.  When you rest for longer periods, mix in some mild activity or stretching between periods of rest. This will help to prevent stiffness and pain.  Avoid sitting for long periods of time without moving. Get up and move around at least one time each hour.  Exercise and stretch regularly, as told by your health care provider.  Do not lift anything that is heavier than 10 lb (4.5 kg) while you have symptoms of sciatica. When you do not have symptoms, you should still avoid heavy lifting, especially repetitive heavy lifting.  When you lift objects, always use proper lifting technique, which includes:  Bending your knees.  Keeping the load close to your body.  Avoiding twisting. General instructions   Use good posture.  Avoid leaning forward while sitting.  Avoid hunching over while standing.  Maintain a healthy weight. Excess weight puts extra stress on your back and makes it difficult to maintain good posture.  Wear supportive, comfortable shoes. Avoid wearing high heels.  Avoid sleeping on a mattress that is too soft or too hard. A mattress that is firm enough to support your back when you sleep may help to reduce your pain.  Keep all follow-up visits as told by your health care provider. This is important. Contact a health care provider if:  You have pain that wakes you up when you are sleeping.  You have pain that gets worse when you lie down.  Your pain is worse than you have experienced in the past.  Your pain lasts longer than 4 weeks.  You experience unexplained weight loss. Get help right away if:  You lose control of your bowel  or bladder (incontinence).  You have:  Weakness in your lower back, pelvis, buttocks, or legs that gets worse.  Redness or swelling of your back.  A burning sensation when you urinate. This information is not intended to replace advice given to you by your health care provider. Make sure you discuss any questions you have with your health care provider. Document Released: 11/04/2001 Document Revised: 04/15/2016 Document Reviewed: 07/20/2015 Elsevier Interactive Patient Education  2017 [employer]  

## 2017-07-17 ENCOUNTER — Other Ambulatory Visit: Payer: Self-pay | Admitting: Neurology

## 2017-07-24 ENCOUNTER — Ambulatory Visit
Admission: RE | Admit: 2017-07-24 | Discharge: 2017-07-24 | Disposition: A | Discharge status: Home / Self Care | Payer: Managed Care, Other (non HMO) | Source: Ambulatory Visit | Attending: Neurology | Admitting: Neurology

## 2017-07-24 ENCOUNTER — Inpatient Hospital Stay: Admission: RE | Admit: 2017-07-24 | Payer: Managed Care, Other (non HMO) | Source: Ambulatory Visit

## 2017-07-24 DIAGNOSIS — G4709 Other insomnia: Secondary | ICD-10-CM

## 2017-07-24 DIAGNOSIS — M5386 Other specified dorsopathies, lumbar region: Secondary | ICD-10-CM

## 2017-07-24 DIAGNOSIS — G4710 Hypersomnia, unspecified: Secondary | ICD-10-CM

## 2017-07-24 DIAGNOSIS — G4730 Sleep apnea, unspecified: Secondary | ICD-10-CM

## 2017-07-24 DIAGNOSIS — F515 Nightmare disorder: Secondary | ICD-10-CM

## 2017-07-24 DIAGNOSIS — R0683 Snoring: Secondary | ICD-10-CM

## 2017-07-24 DIAGNOSIS — M539 Dorsopathy, unspecified: Secondary | ICD-10-CM | POA: Diagnosis not present

## 2017-07-30 ENCOUNTER — Encounter: Payer: BLUE CROSS/BLUE SHIELD | Admitting: Diagnostic Neuroimaging

## 2017-08-11 ENCOUNTER — Encounter: Payer: BLUE CROSS/BLUE SHIELD | Admitting: Neurology

## 2017-08-11 ENCOUNTER — Telehealth: Payer: Self-pay | Admitting: Neurology

## 2017-08-11 NOTE — Telephone Encounter (Signed)
Called patient to discuss the MRI results. Per Dohmeier the MRI shows there is some bony degenerative at L4-5. There was no nerve root compression noted however the patient may experience pain there due to irritation.  1) does pt see a neurosurgeon or orthro if so we will redirect her to follow up with them on this case. 2) IF not then Dr Brett Fairy will offer a referral to [facility] for epidural injections.   No answer when called the patient. Unable to leave voicemail for the patient.

## 2017-08-11 NOTE — Telephone Encounter (Signed)
Patient called wanting MRI results.

## 2017-08-28 ENCOUNTER — Ambulatory Visit (INDEPENDENT_AMBULATORY_CARE_PROVIDER_SITE_OTHER): Payer: BLUE CROSS/BLUE SHIELD | Admitting: Neurology

## 2017-08-28 DIAGNOSIS — G4730 Sleep apnea, unspecified: Secondary | ICD-10-CM

## 2017-08-28 DIAGNOSIS — F515 Nightmare disorder: Secondary | ICD-10-CM

## 2017-08-28 DIAGNOSIS — G4709 Other insomnia: Secondary | ICD-10-CM

## 2017-08-28 DIAGNOSIS — G4710 Hypersomnia, unspecified: Secondary | ICD-10-CM | POA: Diagnosis not present

## 2017-08-28 DIAGNOSIS — R0683 Snoring: Secondary | ICD-10-CM

## 2017-08-28 DIAGNOSIS — M5386 Other specified dorsopathies, lumbar region: Secondary | ICD-10-CM

## 2017-09-04 ENCOUNTER — Encounter: Payer: Self-pay | Admitting: Nurse Practitioner

## 2017-09-04 ENCOUNTER — Ambulatory Visit (INDEPENDENT_AMBULATORY_CARE_PROVIDER_SITE_OTHER): Payer: BLUE CROSS/BLUE SHIELD | Admitting: Nurse Practitioner

## 2017-09-04 VITALS — BP 135/80 | HR 83 | Temp 97.6°F | Ht 66.0 in | Wt 206.0 lb

## 2017-09-04 DIAGNOSIS — Z13820 Encounter for screening for osteoporosis: Secondary | ICD-10-CM

## 2017-09-04 DIAGNOSIS — Z6833 Body mass index (BMI) 33.0-33.9, adult: Secondary | ICD-10-CM | POA: Diagnosis not present

## 2017-09-04 DIAGNOSIS — B009 Herpesviral infection, unspecified: Secondary | ICD-10-CM | POA: Diagnosis not present

## 2017-09-04 DIAGNOSIS — E785 Hyperlipidemia, unspecified: Secondary | ICD-10-CM

## 2017-09-04 DIAGNOSIS — F3342 Major depressive disorder, recurrent, in full remission: Secondary | ICD-10-CM | POA: Diagnosis not present

## 2017-09-04 MED ORDER — VALACYCLOVIR HCL 1 G PO TABS: 1000.0000 mg | ORAL_TABLET | Freq: Three times a day (TID) | ORAL | 1 refills | Status: DC

## 2017-09-04 MED ORDER — ESCITALOPRAM OXALATE 20 MG PO TABS: 20.0000 mg | ORAL_TABLET | Freq: Every day | ORAL | 5 refills | Status: DC

## 2017-09-04 NOTE — Patient Instructions (Signed)
Stress and Stress Management Stress is a normal reaction to life events. It is what you feel when life demands more than you are used to or more than you can handle. Some stress can be useful. For example, the stress reaction can help you catch the last bus of the day, study for a test, or meet a deadline at work. But stress that occurs too often or for too long can cause problems. It can affect your emotional health and interfere with relationships and normal daily activities. Too much stress can weaken your immune system and increase your risk for physical illness. If you already have a medical problem, stress can make it worse. What are the causes? All sorts of life events may cause stress. An event that causes stress for one person may not be stressful for another person. Major life events commonly cause stress. These may be positive or negative. Examples include losing your job, moving into a new home, getting married, having a baby, or losing a loved one. Less obvious life events may also cause stress, especially if they occur day after day or in combination. Examples include working long hours, driving in traffic, caring for children, being in debt, or being in a difficult relationship. What are the signs or symptoms? Stress may cause emotional symptoms including, the following:  Anxiety. This is feeling worried, afraid, on edge, overwhelmed, or out of control.  Anger. This is feeling irritated or impatient.  Depression. This is feeling sad, down, helpless, or guilty.  Difficulty focusing, remembering, or making decisions.  Stress may cause physical symptoms, including the following:  Aches and pains. These may affect your head, neck, back, stomach, or other areas of your body.  Tight muscles or clenched jaw.  Low energy or trouble sleeping.  Stress may cause unhealthy behaviors, including the following:  Eating to feel better (overeating) or skipping meals.  Sleeping too little,  too much, or both.  Working too much or putting off tasks (procrastination).  Smoking, drinking alcohol, or using drugs to feel better.  How is this diagnosed? Stress is diagnosed through an assessment by your health care provider. Your health care provider will ask questions about your symptoms and any stressful life events.Your health care provider will also ask about your medical history and may order blood tests or other tests. Certain medical conditions and medicine can cause physical symptoms similar to stress. Mental illness can cause emotional symptoms and unhealthy behaviors similar to stress. Your health care provider may refer you to a mental health professional for further evaluation. How is this treated? Stress management is the recommended treatment for stress.The goals of stress management are reducing stressful life events and coping with stress in healthy ways. Techniques for reducing stressful life events include the following:  Stress identification. Self-monitor for stress and identify what causes stress for you. These skills may help you to avoid some stressful events.  Time management. Set your priorities, keep a calendar of events, and learn to say "no." These tools can help you avoid making too many commitments.  Techniques for coping with stress include the following:  Rethinking the problem. Try to think realistically about stressful events rather than ignoring them or overreacting. Try to find the positives in a stressful situation rather than focusing on the negatives.  Exercise. Physical exercise can release both physical and emotional tension. The key is to find a form of exercise you enjoy and do it regularly.  Relaxation techniques. These relax the body and  mind. Examples include yoga, meditation, tai chi, biofeedback, deep breathing, progressive muscle relaxation, listening to music, being out in nature, journaling, and other hobbies. Again, the key is to find  one or more that you enjoy and can do regularly.  Healthy lifestyle. Eat a balanced diet, get plenty of sleep, and do not smoke. Avoid using alcohol or drugs to relax.  Strong support network. Spend time with family, friends, or other people you enjoy being around.Express your feelings and talk things over with someone you trust.  Counseling or talktherapy with a mental health professional may be helpful if you are having difficulty managing stress on your own. Medicine is typically not recommended for the treatment of stress.Talk to your health care provider if you think you need medicine for symptoms of stress. Follow these instructions at home:  Keep all follow-up visits as directed by your health care provider.  Take all medicines as directed by your health care provider. Contact a health care provider if:  Your symptoms get worse or you start having new symptoms.  You feel overwhelmed by your problems and can no longer manage them on your own. Get help right away if:  You feel like hurting yourself or someone else. This information is not intended to replace advice given to you by your health care provider. Make sure you discuss any questions you have with your health care provider. Document Released: 05/06/2001 Document Revised: 04/17/2016 Document Reviewed: 07/05/2013 Elsevier Interactive Patient Education  2017 Elsevier Inc.  

## 2017-09-04 NOTE — Progress Notes (Signed)
Subjective:    Patient ID: Mikayla Knight, female    DOB: 09/08/1951, 66 y.o.   MRN: [id number]  HPI   Mikayla Knight is here today for follow up of chronic medical problem.  Outpatient Encounter Prescriptions as of 09/04/2017  Medication Sig  . atorvastatin (LIPITOR) 40 MG tablet Take 1 tablet (40 mg total) by mouth daily.  Marland Kitchen PARoxetine (PAXIL-CR) 37.5 MG 24 hr tablet Take 1 tablet (37.5 mg total) by mouth daily.  . valACYclovir (VALTREX) 1000 MG tablet Take 1 tablet (1,000 mg total) by mouth 3 (three) times daily.   No facility-administered encounter medications on file as of 09/04/2017.     1. Hyperlipidemia with target LDL less than 100  Does ot watch diet  2. Recurrent major depressive disorder, in full remission  [facility])  Patient has been on paxil for years, says she would like to change to something else. Depression screen [facility] 2/9 09/04/2017 04/09/2017 04/09/2017  Decreased Interest 3 0 0  Down, Depressed, Hopeless 2 0 0  PHQ - 2 Score 5 0 0  Altered sleeping 2 - -  Tired, decreased energy 3 - -  Change in appetite 0 - -  Feeling bad or failure about yourself  0 - -  Trouble concentrating 2 - -  Moving slowly or fidgety/restless 0 - -  Suicidal thoughts 0 - -  PHQ-9 Score 12 - -  Difficult doing work/chores Somewhat difficult - -     3. BMI 33.0-33.9,adult  No recent weight changes    New complaints: None today  Social history: Lives alone in [place]- still working   Review of Systems  Constitutional: Negative for activity change and appetite change.  HENT: Negative.   Eyes: Negative for pain.  Respiratory: Negative for shortness of breath.   Cardiovascular: Negative for chest pain, palpitations and leg swelling.  Gastrointestinal: Negative for abdominal pain.  Endocrine: Negative for polydipsia.  Genitourinary: Negative.   Skin: Negative for rash.  Neurological: Negative for dizziness, weakness and headaches.  Hematological: Does not bruise/bleed  easily.  Psychiatric/Behavioral: Negative.   All other systems reviewed and are negative.      Objective:   Physical Exam  Constitutional: She is oriented to person, place, and time. She appears well-developed and well-nourished.  HENT:  Nose: Nose normal.  Mouth/Throat: Oropharynx is clear and moist.  Eyes: EOM are normal.  Neck: Trachea normal, normal range of motion and full passive range of motion without pain. Neck supple. No JVD present. Carotid bruit is not present. No thyromegaly present.  Cardiovascular: Normal rate, regular rhythm, normal heart sounds and intact distal pulses.  Exam reveals no gallop and no friction rub.   No murmur heard. Pulmonary/Chest: Effort normal and breath sounds normal.  Abdominal: Soft. Bowel sounds are normal. She exhibits no distension and no mass. There is no tenderness.  Musculoskeletal: Normal range of motion.  Lymphadenopathy:    She has no cervical adenopathy.  Neurological: She is alert and oriented to person, place, and time. She has normal reflexes.  Skin: Skin is warm and dry.  Psychiatric: She has a normal mood and affect. Her behavior is normal. Judgment and thought content normal.    BP 135/80   Pulse 83   Temp 97.6 F (36.4 C) (Oral)   Ht '5\' 6"'  (1.676 m)   Wt 206 lb (93.4 kg)   BMI 33.25 kg/m      Assessment & Plan:  1. Hyperlipidemia with target LDL less than 100 low fat  diet - CMP14+EGFR - Lipid panel  2. Recurrent major depressive disorder, in full remission ([facility]) Stress management Stop paxil - escitalopram (LEXAPRO) 20 MG tablet; Take 1 tablet (20 mg total) by mouth daily.  Dispense: 30 tablet; Refill: 5  3. BMI 33.0-33.9,adult Discussed diet and exercise for person with BMI >25 Will recheck weight in 3-6 months   4. HSV-1 (herpes simplex virus 1) infection - valACYclovir (VALTREX) 1000 MG tablet; Take 1 tablet (1,000 mg total) by mouth 3 (three) times daily.  Dispense: 30 tablet; Refill: 1    Labs  pending Health maintenance reviewed Diet and exercise encouraged Continue all meds Follow up  In 6 months   [place], FNP

## 2017-09-06 LAB — LIPID PANEL
Chol/HDL Ratio: 7.7 ratio — ABNORMAL HIGH (ref 0.0–4.4)
Cholesterol, Total: 292 mg/dL — ABNORMAL HIGH (ref 100–199)
HDL: 38 mg/dL — ABNORMAL LOW (ref >39)
LDL Calculated: 209 mg/dL — ABNORMAL HIGH (ref 0–99)
Triglycerides: 225 mg/dL — ABNORMAL HIGH (ref 0–149)
VLDL Cholesterol Cal: 45 mg/dL — ABNORMAL HIGH (ref 5–40)

## 2017-09-06 LAB — CMP14+EGFR
ALBUMIN: 4.4 g/dL (ref 3.6–4.8)
ALK PHOS: 87 IU/L (ref 39–117)
ALT: 17 IU/L (ref 0–32)
AST: 24 IU/L (ref 0–40)
Albumin/Globulin Ratio: 1.6 (ref 1.2–2.2)
BUN / CREAT RATIO: 13 (ref 12–28)
BUN: 12 mg/dL (ref 8–27)
Bilirubin Total: 0.3 mg/dL (ref 0.0–1.2)
CO2: 18 mmol/L — AB (ref 20–29)
CREATININE: 0.9 mg/dL (ref 0.57–1.00)
Calcium: 9.1 mg/dL (ref 8.7–10.3)
Chloride: 101 mmol/L (ref 96–106)
GFR, EST AFRICAN AMERICAN: 78 mL/min/{1.73_m2} (ref >59)
GFR, EST NON AFRICAN AMERICAN: 67 mL/min/{1.73_m2} (ref >59)
GLOBULIN, TOTAL: 2.8 g/dL (ref 1.5–4.5)
Glucose: 101 mg/dL — ABNORMAL HIGH (ref 65–99)
Potassium: 4.5 mmol/L (ref 3.5–5.2)
SODIUM: 141 mmol/L (ref 134–144)
Total Protein: 7.2 g/dL (ref 6.0–8.5)

## 2017-09-08 ENCOUNTER — Ambulatory Visit: Payer: BLUE CROSS/BLUE SHIELD | Admitting: Nurse Practitioner

## 2017-09-08 NOTE — Procedures (Signed)
PATIENT'S NAME:  Mikayla Knight, Mikayla Knight DOB:      October 12, 1951      MR#:    [id number]     DATE OF RECORDING: 08/28/2017 REFERRING M.D.:  [name], FNP Study Performed:   Baseline Polysomnogram HISTORY:  66 year old female patient was diagnosed with OSA in the past but did not obtain CPAP therapy. She carries a diagnosis of obesity, hyperlipidemia, major depression, and significant fatigue. She yells and thrashes in her sleep, reports vivid dreams. Loud snoring reported.   The patient endorsed the Epworth Sleepiness Scale at 6/24 points.   The patient's weight 207 pounds with a height of 66 (inches), resulting in a BMI of 33.3 kg/m2. The patient's neck circumference measured 15 inches.  CURRENT MEDICATIONS: Lipitor, Paxil-xr, Valtrex,   PROCEDURE:  This is a multichannel digital polysomnogram utilizing the Somnostar 11.2 system.  Electrodes and sensors were applied and monitored per AASM Specifications.   EEG, EOG, Chin and Limb EMG, were sampled at 200 Hz.  ECG, Snore and Nasal Pressure, Thermal Airflow, Respiratory Effort, CPAP Flow and Pressure, Oximetry was sampled at 50 Hz. Digital video and audio were recorded.      BASELINE STUDY  Lights Out was at 00:02 and Lights On at 07:24.  Total recording time (TRT) was 442.5 minutes, with a total sleep time (TST) of 404 minutes.   The patient's sleep latency was 38 minutes.  REM latency was 108 minutes.  The sleep efficiency was 91.3 %.     SLEEP ARCHITECTURE: WASO (Wake after sleep onset) was 29.5 minutes.  There were 15.5 minutes in Stage N1, 290 minutes Stage N2, 16.5 minutes Stage N3 and 82 minutes in Stage REM.  The percentage of Stage N1 was 3.8%, Stage N2 was 71.8%, Stage N3 was 4.1% and Stage R (REM sleep) was 20.3%.   RESPIRATORY ANALYSIS:  There were a total of 74 respiratory events:  19 obstructive apneas, 0 central apneas and 0 mixed apneas with 55 hypopneas and 22 respiratory event related arousals (RERAs).     The total  APNEA/HYPOPNEA INDEX (AHI) was 11.0/hour and the total RESPIRATORY DISTURBANCE INDEX was 14.3 /hour.  34 events occurred in REM sleep and 62 events in NREM. The REM AHI was 24.9 /hour, versus a non-REM AHI of 7.5. The patient spent 294.5 minutes of total sleep time in the supine position and 110 minutes in non-supine. The supine AHI was 14.9 versus a non-supine AHI of 0.5.  OXYGEN SATURATION & C02:  The Wake baseline 02 saturation was 94%, with the lowest being 85%. Time spent below 89% saturation equaled 6 minutes.   PERIODIC LIMB MOVEMENTS: The patient had a total of 0 Periodic Limb Movements.  The arousals were noted as: 53 were spontaneous, 0 were associated with PLMs, and 12 were associated with respiratory events.  Audio and video analysis did not show any abnormal or unusual movements, behaviors, phonations or vocalizations.   Loud Snoring was noted. EKG was in keeping with normal sinus rhythm (NSR). Post-study, the patient indicated that sleep was better than usual.    IMPRESSION:  1. Mild Obstructive Sleep Apnea (OSA) with mild to moderate UARS ( upper airway resistance syndrome), causing loud snoring. 2. Normal sleep architecture and efficiency. No parasomnia recorded.   RECOMMENDATIONS:  1. Advise full-night, attended, CPAP titration study to optimize therapy for OSA with REM accentuation and UARS. Alternatively, a dental device can be entertained, but is less likely to address REM dependent apnea.   2. Advise to  lose weight, by diet and exercise, if not contraindicated (BMI 33.3) 3. Further information regarding OSA may be obtained from [employer] (www.sleepfoundation.org) or American Sleep Apnea Association (www.sleepapnea.org). 4. A follow up appointment will be scheduled in the Sleep Clinic at [facility]. The referring provider will be notified of the results.      I certify that I have reviewed the entire raw data recording prior to the  issuance of this report in accordance with the Standards of Accreditation of the American Academy of Sleep Medicine (AASM)    Larey Seat, MD      09-08-2017  Diplomat, American Board of Psychiatry and Neurology  Diplomat, American Board of [place] Director, [facility] [employer]

## 2017-09-08 NOTE — Addendum Note (Signed)
Addended by: Larey Seat on: 09/08/2017 05:17 PM   Modules accepted: Orders

## 2017-09-09 ENCOUNTER — Telehealth: Payer: Self-pay | Admitting: Neurology

## 2017-09-09 NOTE — Telephone Encounter (Signed)
-----   Message from Larey Seat, MD sent at 09/08/2017  5:17 PM EDT ----- recommned return for CPAP due to REM dependent but mild apnea. Has louder snoring with arousals from snoring( RERAs ) also known as UARS- I ordered CPAP return. CD

## 2017-09-09 NOTE — Telephone Encounter (Signed)
Called patient to discuss sleep study results. Pt will come back in for cpap titration to help with treatment of her mild apnea

## 2017-09-10 ENCOUNTER — Other Ambulatory Visit: Payer: BLUE CROSS/BLUE SHIELD

## 2017-09-22 ENCOUNTER — Encounter: Payer: Self-pay | Admitting: *Deleted

## 2017-09-28 ENCOUNTER — Ambulatory Visit (INDEPENDENT_AMBULATORY_CARE_PROVIDER_SITE_OTHER): Payer: BLUE CROSS/BLUE SHIELD

## 2017-09-28 DIAGNOSIS — Z780 Asymptomatic menopausal state: Secondary | ICD-10-CM

## 2017-09-28 DIAGNOSIS — Z13820 Encounter for screening for osteoporosis: Secondary | ICD-10-CM | POA: Diagnosis not present

## 2017-10-07 ENCOUNTER — Telehealth: Payer: Self-pay | Admitting: Neurology

## 2017-10-07 NOTE — Telephone Encounter (Signed)
We have attempted to call the patient 2 times to schedule sleep study. Patient has been unavailable at the phone numbers we have on file and has not returned our calls. At this point we will send a letter asking pt to please contact the sleep lab to schedule their sleep study. If patient calls back we will schedule them for their sleep study. ° °

## 2018-01-01 ENCOUNTER — Telehealth: Payer: Self-pay | Admitting: Nurse Practitioner

## 2018-01-01 NOTE — Telephone Encounter (Signed)
Pt c/o nasal congestion with coughing up mucus. Pt states has been going on a week. Denies fever or any other symptoms. Pt uses [facility] in [place]. Pt has tried otc Mucenix it does help with cough. Pt is want zpac in. Please advise.

## 2018-01-01 NOTE — Telephone Encounter (Signed)
She will need  To do evisit or be seen in office

## 2018-01-04 ENCOUNTER — Ambulatory Visit: Payer: BLUE CROSS/BLUE SHIELD | Admitting: Nurse Practitioner

## 2018-01-04 NOTE — Telephone Encounter (Signed)
She is feeling better today and does not need to be seen.

## 2018-03-09 ENCOUNTER — Other Ambulatory Visit: Payer: Self-pay | Admitting: Nurse Practitioner

## 2018-03-09 DIAGNOSIS — F3342 Major depressive disorder, recurrent, in full remission: Secondary | ICD-10-CM

## 2018-03-09 NOTE — Telephone Encounter (Signed)
Last seen 09/04/17

## 2018-03-11 ENCOUNTER — Telehealth: Payer: Self-pay | Admitting: Nurse Practitioner

## 2018-03-11 ENCOUNTER — Other Ambulatory Visit: Payer: Self-pay | Admitting: Nurse Practitioner

## 2018-03-11 DIAGNOSIS — F3342 Major depressive disorder, recurrent, in full remission: Secondary | ICD-10-CM

## 2018-03-11 NOTE — Telephone Encounter (Signed)
What is the name of the medication? escitalopram (LEXAPRO) 20 MG tablet  Have you contacted your pharmacy to request a refill? Yes pt is out  Which pharmacy would you like this sent to? [facility]   Patient notified that their request is being sent to the clinical staff for review and that they should receive a call once it is complete. If they do not receive a call within 24 hours they can check with their pharmacy or our office.

## 2018-03-11 NOTE — Telephone Encounter (Signed)
Patient aware that refill of lexapro was sent to the pharmacy on 03/09/18 at 10:54 am

## 2018-03-23 DIAGNOSIS — Z1231 Encounter for screening mammogram for malignant neoplasm of breast: Secondary | ICD-10-CM | POA: Diagnosis not present

## 2018-03-23 LAB — HM MAMMOGRAPHY

## 2018-04-06 ENCOUNTER — Other Ambulatory Visit: Payer: Self-pay

## 2018-04-06 ENCOUNTER — Telehealth: Payer: Self-pay | Admitting: Nurse Practitioner

## 2018-04-06 DIAGNOSIS — F3342 Major depressive disorder, recurrent, in full remission: Secondary | ICD-10-CM

## 2018-04-06 MED ORDER — ESCITALOPRAM OXALATE 20 MG PO TABS: 20.0000 mg | ORAL_TABLET | Freq: Every day | ORAL | 0 refills | Status: DC

## 2018-04-08 ENCOUNTER — Other Ambulatory Visit: Payer: Self-pay | Admitting: Nurse Practitioner

## 2018-04-08 DIAGNOSIS — F3342 Major depressive disorder, recurrent, in full remission: Secondary | ICD-10-CM

## 2018-04-09 ENCOUNTER — Ambulatory Visit: Payer: BLUE CROSS/BLUE SHIELD | Admitting: Nurse Practitioner

## 2018-04-09 ENCOUNTER — Encounter (INDEPENDENT_AMBULATORY_CARE_PROVIDER_SITE_OTHER): Payer: Self-pay

## 2018-04-09 ENCOUNTER — Encounter: Payer: Self-pay | Admitting: Nurse Practitioner

## 2018-04-09 VITALS — BP 119/73 | HR 84 | Temp 97.2°F | Ht 66.0 in | Wt 207.0 lb

## 2018-04-09 DIAGNOSIS — Z6833 Body mass index (BMI) 33.0-33.9, adult: Secondary | ICD-10-CM

## 2018-04-09 DIAGNOSIS — B009 Herpesviral infection, unspecified: Secondary | ICD-10-CM | POA: Diagnosis not present

## 2018-04-09 DIAGNOSIS — F3342 Major depressive disorder, recurrent, in full remission: Secondary | ICD-10-CM

## 2018-04-09 DIAGNOSIS — E785 Hyperlipidemia, unspecified: Secondary | ICD-10-CM | POA: Diagnosis not present

## 2018-04-09 LAB — LIPID PANEL
CHOLESTEROL TOTAL: 160 mg/dL (ref 100–199)
Chol/HDL Ratio: 3.5 ratio (ref 0.0–4.4)
HDL: 46 mg/dL (ref >39)
LDL Calculated: 92 mg/dL (ref 0–99)
TRIGLYCERIDES: 112 mg/dL (ref 0–149)
VLDL CHOLESTEROL CAL: 22 mg/dL (ref 5–40)

## 2018-04-09 LAB — CMP14+EGFR
ALK PHOS: 79 IU/L (ref 39–117)
ALT: 17 IU/L (ref 0–32)
AST: 18 IU/L (ref 0–40)
Albumin/Globulin Ratio: 2 (ref 1.2–2.2)
Albumin: 4.3 g/dL (ref 3.6–4.8)
BUN/Creatinine Ratio: 22 (ref 12–28)
BUN: 17 mg/dL (ref 8–27)
CHLORIDE: 104 mmol/L (ref 96–106)
CO2: 22 mmol/L (ref 20–29)
Calcium: 9.2 mg/dL (ref 8.7–10.3)
Creatinine, Ser: 0.76 mg/dL (ref 0.57–1.00)
GFR calc Af Amer: 95 mL/min/{1.73_m2} (ref >59)
GFR calc non Af Amer: 82 mL/min/{1.73_m2} (ref >59)
GLUCOSE: 104 mg/dL — AB (ref 65–99)
Globulin, Total: 2.2 g/dL (ref 1.5–4.5)
Potassium: 4.2 mmol/L (ref 3.5–5.2)
Sodium: 142 mmol/L (ref 134–144)
Total Protein: 6.5 g/dL (ref 6.0–8.5)

## 2018-04-09 MED ORDER — ESCITALOPRAM OXALATE 20 MG PO TABS: 20.0000 mg | ORAL_TABLET | Freq: Every day | ORAL | 0 refills | Status: DC

## 2018-04-09 MED ORDER — VALACYCLOVIR HCL 1 G PO TABS: 1000.0000 mg | ORAL_TABLET | Freq: Three times a day (TID) | ORAL | 1 refills | Status: DC

## 2018-04-09 MED ORDER — ATORVASTATIN CALCIUM 40 MG PO TABS: 40.0000 mg | ORAL_TABLET | Freq: Every day | ORAL | 1 refills | Status: DC

## 2018-04-09 NOTE — Patient Instructions (Signed)
Fat and Cholesterol Restricted Diet High levels of fat and cholesterol in your blood may lead to various health problems, such as diseases of the heart, blood vessels, gallbladder, liver, and pancreas. Fats are concentrated sources of energy that come in various forms. Certain types of fat, including saturated fat, may be harmful in excess. Cholesterol is a substance needed by your body in small amounts. Your body makes all the cholesterol it needs. Excess cholesterol comes from the food you eat. When you have high levels of cholesterol and saturated fat in your blood, health problems can develop because the excess fat and cholesterol will gather along the walls of your blood vessels, causing them to narrow. Choosing the right foods will help you control your intake of fat and cholesterol. This will help keep the levels of these substances in your blood within normal limits and reduce your risk of disease. What is my plan? Your health care provider recommends that you:  Limit your fat intake to ______% or less of your total calories per day.  Limit the amount of cholesterol in your diet to less than _________mg per day.  Eat 20-30 grams of fiber each day.  What types of fat should I choose?  Choose healthy fats more often. Choose monounsaturated and polyunsaturated fats, such as olive and canola oil, flaxseeds, walnuts, almonds, and seeds.  Eat more omega-3 fats. Good choices include salmon, mackerel, sardines, tuna, flaxseed oil, and ground flaxseeds. Aim to eat fish at least two times a week.  Limit saturated fats. Saturated fats are primarily found in animal products, such as meats, butter, and cream. Plant sources of saturated fats include palm oil, palm kernel oil, and coconut oil.  Avoid foods with partially hydrogenated oils in them. These contain trans fats. Examples of foods that contain trans fats are stick margarine, some tub margarines, cookies, crackers, and other baked goods. What  general guidelines do I need to follow? These guidelines for healthy eating will help you control your intake of fat and cholesterol:  Check food labels carefully to identify foods with trans fats or high amounts of saturated fat.  Fill one half of your plate with vegetables and green salads.  Fill one fourth of your plate with whole grains. Look for the word "whole" as the first word in the ingredient list.  Fill one fourth of your plate with lean protein foods.  Limit fruit to two servings a day. Choose fruit instead of juice.  Eat more foods that contain fiber, such as apples, broccoli, carrots, beans, peas, and barley.  Eat more home-cooked food and less restaurant, buffet, and fast food.  Limit or avoid alcohol.  Limit foods high in starch and sugar.  Limit fried foods.  Cook foods using methods other than frying. Baking, boiling, grilling, and broiling are all great options.  Lose weight if you are overweight. Losing just 5-10% of your initial body weight can help your overall health and prevent diseases such as diabetes and heart disease.  What foods can I eat? Grains  Whole grains, such as whole wheat or whole grain breads, crackers, cereals, and pasta. Unsweetened oatmeal, bulgur, barley, quinoa, or brown rice. Corn or whole wheat flour tortillas. Vegetables  Fresh or frozen vegetables (raw, steamed, roasted, or grilled). Green salads. Fruits  All fresh, canned (in natural juice), or frozen fruits. Meats and other protein foods  Ground beef (85% or leaner), grass-fed beef, or beef trimmed of fat. Skinless chicken or turkey. Ground chicken or turkey.   Pork trimmed of fat. All fish and seafood. Eggs. Dried beans, peas, or lentils. Unsalted nuts or seeds. Unsalted canned or dry beans. Dairy  Low-fat dairy products, such as skim or 1% milk, 2% or reduced-fat cheeses, low-fat ricotta or cottage cheese, or plain low-fat yo Fats and oils  Tub margarines without trans  fats. Light or reduced-fat mayonnaise and salad dressings. Avocado. Olive, canola, sesame, or safflower oils. Natural peanut or almond butter (choose ones without added sugar and oil). The items listed above may not be a complete list of recommended foods or beverages. Contact your dietitian for more options. Foods to avoid Grains  White bread. White pasta. White rice. Cornbread. Bagels, pastries, and croissants. Crackers that contain trans fat. Vegetables  White potatoes. Corn. Creamed or fried vegetables. Vegetables in a cheese sauce. Fruits  Dried fruits. Canned fruit in light or heavy syrup. Fruit juice. Meats and other protein foods  Fatty cuts of meat. Ribs, chicken wings, bacon, sausage, bologna, salami, chitterlings, fatback, hot dogs, bratwurst, and packaged luncheon meats. Liver and organ meats. Dairy  Whole or 2% milk, cream, half-and-half, and cream cheese. Whole milk cheeses. Whole-fat or sweetened yogurt. Full-fat cheeses. Nondairy creamers and whipped toppings. Processed cheese, cheese spreads, or cheese curds. Beverages  Alcohol. Sweetened drinks (such as sodas, lemonade, and fruit drinks or punches). Fats and oils  Butter, stick margarine, lard, shortening, ghee, or bacon fat. Coconut, palm kernel, or palm oils. Sweets and desserts  Corn syrup, sugars, honey, and molasses. Candy. Jam and jelly. Syrup. Sweetened cereals. Cookies, pies, cakes, donuts, muffins, and ice cream. The items listed above may not be a complete list of foods and beverages to avoid. Contact your dietitian for more information. This information is not intended to replace advice given to you by your health care provider. Make sure you discuss any questions you have with your health care provider. Document Released: 11/10/2005 Document Revised: 12/01/2014 Document Reviewed: 02/08/2014 Elsevier Interactive Patient Education  2018 Elsevier Inc.  

## 2018-04-09 NOTE — Progress Notes (Signed)
Subjective:    Patient ID: Mikayla Knight, female    DOB: 29-Jan-1951, 66 y.o.   MRN: [id number]   Chief Complaint: Medical Management of [place] issues   HPI:  1. Hyperlipidemia with target LDL less than 100  not really watching diet  2. Recurrent major depressive disorder, in full remission ([place])  Has been on lexapro for a long time. Says she is doing quite well. Depression screen [facility] 2/9 04/09/2018 09/04/2017 04/09/2017  Decreased Interest 0 3 0  Down, Depressed, Hopeless 0 2 0  PHQ - 2 Score 0 5 0  Altered sleeping - 2 -  Tired, decreased energy - 3 -  Change in appetite - 0 -  Feeling bad or failure about yourself  - 0 -  Trouble concentrating - 2 -  Moving slowly or fidgety/restless - 0 -  Suicidal thoughts - 0 -  PHQ-9 Score - 12 -  Difficult doing work/chores - Somewhat difficult -     3. BMI 33.0-33.9,adult  No recent weight changes  4.      HSV-!          Takes valtrex daily to prevent   Outpatient Encounter Medications as of 04/09/2018  Medication Sig  . atorvastatin (LIPITOR) 40 MG tablet Take 1 tablet (40 mg total) by mouth daily.  Marland Kitchen escitalopram (LEXAPRO) 20 MG tablet Take 1 tablet (20 mg total) by mouth daily.  . valACYclovir (VALTREX) 1000 MG tablet Take 1 tablet (1,000 mg total) by mouth 3 (three) times daily.   No facility-administered encounter medications on file as of 04/09/2018.       New complaints: None today  Social history: Lives alone in [place].    Review of Systems  Constitutional: Negative for activity change and appetite change.  HENT: Negative.   Eyes: Negative for pain.  Respiratory: Negative for shortness of breath.   Cardiovascular: Negative for chest pain, palpitations and leg swelling.  Gastrointestinal: Negative for abdominal pain.  Endocrine: Negative for polydipsia.  Genitourinary: Negative.   Skin: Negative for rash.  Neurological: Negative for dizziness, weakness and headaches.  Hematological: Does not  bruise/bleed easily.  Psychiatric/Behavioral: Negative.   All other systems reviewed and are negative.      Objective:   Physical Exam  Constitutional: She is oriented to person, place, and time.  HENT:  Head: Normocephalic.  Nose: Nose normal.  Mouth/Throat: Oropharynx is clear and moist.  Eyes: Pupils are equal, round, and reactive to light. EOM are normal.  Neck: Normal range of motion. Neck supple. No JVD present. Carotid bruit is not present.  Cardiovascular: Normal rate, regular rhythm, normal heart sounds and intact distal pulses.  Pulmonary/Chest: Effort normal and breath sounds normal. No respiratory distress. She has no wheezes. She has no rales. She exhibits no tenderness.  Abdominal: Soft. Normal appearance, normal aorta and bowel sounds are normal. She exhibits no distension, no abdominal bruit, no pulsatile midline mass and no mass. There is no splenomegaly or hepatomegaly. There is no tenderness.  Musculoskeletal: Normal range of motion. She exhibits no edema.  Lymphadenopathy:    She has no cervical adenopathy.  Neurological: She is alert and oriented to person, place, and time. She has normal reflexes.  Skin: Skin is warm and dry.  Psychiatric: Judgment normal.   BP 119/73   Pulse 84   Temp (!) 97.2 F (36.2 C) (Oral)   Ht _0  (1.676 m)   Wt 207 lb (93.9 kg)   BMI 33.41 kg/m  Assessment & Plan:  .Mikayla Knight comes in today with chief complaint of Medical Management of Chronic Issues   Diagnosis and orders addressed:  1. Hyperlipidemia with target LDL less than 100 Low fat diet - atorvastatin (LIPITOR) 40 MG tablet; Take 1 tablet (40 mg total) by mouth daily.  Dispense: 90 tablet; Refill: 1 - CMP14+EGFR - Lipid panel  2. Recurrent major depressive disorder, in full remission ([place]) Stress management - escitalopram (LEXAPRO) 20 MG tablet; Take 1 tablet (20 mg total) by mouth daily.  Dispense: 30 tablet; Refill: 0  3. BMI  33.0-33.9,adult Discussed diet and exercise for person with BMI >25 Will recheck weight in 3-6 months   4. HSV-1 (herpes simplex virus 1) infection Vitamin e will help with flare up - valACYclovir (VALTREX) 1000 MG tablet; Take 1 tablet (1,000 mg total) by mouth 3 (three) times daily.  Dispense: 30 tablet; Refill: 1   Labs pending Health Maintenance reviewed Diet and exercise encouraged  Follow up plan: 6 months   [name], FNP

## 2018-04-14 ENCOUNTER — Ambulatory Visit: Payer: BLUE CROSS/BLUE SHIELD | Admitting: Nurse Practitioner

## 2018-06-05 ENCOUNTER — Other Ambulatory Visit: Payer: Self-pay | Admitting: Nurse Practitioner

## 2018-06-05 DIAGNOSIS — F3342 Major depressive disorder, recurrent, in full remission: Secondary | ICD-10-CM

## 2018-06-08 NOTE — Telephone Encounter (Signed)
OV 10/12/18 (04/09/18 OV note RTC 57mos)

## 2018-06-10 ENCOUNTER — Other Ambulatory Visit: Payer: Self-pay | Admitting: Nurse Practitioner

## 2018-06-10 NOTE — Telephone Encounter (Signed)
Spoke with pt and advised rx sent over to [facility].

## 2018-07-29 ENCOUNTER — Other Ambulatory Visit: Payer: Self-pay | Admitting: Nurse Practitioner

## 2018-07-29 DIAGNOSIS — B009 Herpesviral infection, unspecified: Secondary | ICD-10-CM

## 2018-08-23 ENCOUNTER — Other Ambulatory Visit: Payer: Self-pay | Admitting: Nurse Practitioner

## 2018-08-23 DIAGNOSIS — B009 Herpesviral infection, unspecified: Secondary | ICD-10-CM

## 2018-08-23 DIAGNOSIS — E785 Hyperlipidemia, unspecified: Secondary | ICD-10-CM

## 2018-08-23 DIAGNOSIS — F3342 Major depressive disorder, recurrent, in full remission: Secondary | ICD-10-CM

## 2018-08-23 MED ORDER — ESCITALOPRAM OXALATE 20 MG PO TABS: 20.0000 mg | ORAL_TABLET | Freq: Every day | ORAL | 0 refills | Status: DC

## 2018-08-23 MED ORDER — VALACYCLOVIR HCL 1 G PO TABS: 1000.0000 mg | ORAL_TABLET | Freq: Three times a day (TID) | ORAL | 1 refills | Status: DC

## 2018-08-23 MED ORDER — ATORVASTATIN CALCIUM 40 MG PO TABS: 40.0000 mg | ORAL_TABLET | Freq: Every day | ORAL | 0 refills | Status: DC

## 2018-08-23 NOTE — Telephone Encounter (Signed)
What is the name of the medication?   atorvastatin (LIPITOR) 40 MG tablet    escitalopram (LEXAPRO) 20 MG tablet    valACYclovir (VALTREX) 1000 MG tablet      Have you contacted your pharmacy to request a refill? yes  Which pharmacy would you like this sent to? [facility]   Patient notified that their request is being sent to the clinical staff for review and that they should receive a call once it is complete. If they do not receive a call within 24 hours they can check with their pharmacy or our office.

## 2018-08-23 NOTE — Telephone Encounter (Signed)
OV 10/12/18 Pt aware RFs sent

## 2018-10-12 ENCOUNTER — Ambulatory Visit: Payer: BLUE CROSS/BLUE SHIELD | Admitting: Nurse Practitioner

## 2018-10-12 ENCOUNTER — Other Ambulatory Visit: Payer: Self-pay

## 2018-10-12 ENCOUNTER — Encounter: Payer: Self-pay | Admitting: Nurse Practitioner

## 2018-10-12 VITALS — BP 122/68 | HR 75 | Temp 97.0°F | Ht 66.0 in | Wt 209.0 lb

## 2018-10-12 DIAGNOSIS — E785 Hyperlipidemia, unspecified: Secondary | ICD-10-CM | POA: Diagnosis not present

## 2018-10-12 DIAGNOSIS — Z23 Encounter for immunization: Secondary | ICD-10-CM

## 2018-10-12 DIAGNOSIS — G4730 Sleep apnea, unspecified: Secondary | ICD-10-CM

## 2018-10-12 DIAGNOSIS — F3342 Major depressive disorder, recurrent, in full remission: Secondary | ICD-10-CM

## 2018-10-12 DIAGNOSIS — Z6833 Body mass index (BMI) 33.0-33.9, adult: Secondary | ICD-10-CM | POA: Diagnosis not present

## 2018-10-12 DIAGNOSIS — G4733 Obstructive sleep apnea (adult) (pediatric): Secondary | ICD-10-CM

## 2018-10-12 LAB — CMP14+EGFR
ALBUMIN: 4.3 g/dL (ref 3.6–4.8)
ALK PHOS: 101 IU/L (ref 39–117)
ALT: 19 IU/L (ref 0–32)
AST: 18 IU/L (ref 0–40)
Albumin/Globulin Ratio: 1.9 (ref 1.2–2.2)
BILIRUBIN TOTAL: 0.3 mg/dL (ref 0.0–1.2)
BUN / CREAT RATIO: 19 (ref 12–28)
BUN: 16 mg/dL (ref 8–27)
CO2: 24 mmol/L (ref 20–29)
Calcium: 9.5 mg/dL (ref 8.7–10.3)
Chloride: 102 mmol/L (ref 96–106)
Creatinine, Ser: 0.83 mg/dL (ref 0.57–1.00)
GFR calc Af Amer: 85 mL/min/{1.73_m2} (ref >59)
GFR calc non Af Amer: 74 mL/min/{1.73_m2} (ref >59)
GLUCOSE: 88 mg/dL (ref 65–99)
Globulin, Total: 2.3 g/dL (ref 1.5–4.5)
Potassium: 4.8 mmol/L (ref 3.5–5.2)
SODIUM: 140 mmol/L (ref 134–144)
Total Protein: 6.6 g/dL (ref 6.0–8.5)

## 2018-10-12 LAB — LIPID PANEL
CHOL/HDL RATIO: 3.6 ratio (ref 0.0–4.4)
Cholesterol, Total: 162 mg/dL (ref 100–199)
HDL: 45 mg/dL (ref >39)
LDL CALC: 95 mg/dL (ref 0–99)
TRIGLYCERIDES: 112 mg/dL (ref 0–149)
VLDL CHOLESTEROL CAL: 22 mg/dL (ref 5–40)

## 2018-10-12 MED ORDER — ATORVASTATIN CALCIUM 40 MG PO TABS: 40.0000 mg | ORAL_TABLET | Freq: Every day | ORAL | 0 refills | Status: DC

## 2018-10-12 MED ORDER — ESCITALOPRAM OXALATE 20 MG PO TABS: 20.0000 mg | ORAL_TABLET | Freq: Every day | ORAL | 0 refills | Status: DC

## 2018-10-12 NOTE — Addendum Note (Signed)
Addended by: Rolena Infante on: 10/12/2018 09:54 AM   Modules accepted: Orders

## 2018-10-12 NOTE — Patient Instructions (Signed)

## 2018-10-12 NOTE — Progress Notes (Signed)
Subjective:    Patient ID: Mikayla Knight, female    DOB: 04/16/1951, 67 y.o.   MRN: [id number]   Chief Complaint: No chief complaint on file.   HPI:  1. Hyperlipidemia with target LDL less than 100  Does not watch diet and does very little exercise.  2. BMI 33.0-33.9,adult  Weight is up 2 lbs from previous  3. Recurrent major depressive disorder, in full remission [facility])  He is currently on lexapro. Doing well without side effects Depression screen [facility] 2/9 10/12/2018 04/09/2018 09/04/2017  Decreased Interest 0 0 3  Down, Depressed, Hopeless 0 0 2  PHQ - 2 Score 0 0 5  Altered sleeping - - 2  Tired, decreased energy - - 3  Change in appetite - - 0  Feeling bad or failure about yourself  - - 0  Trouble concentrating - - 2  Moving slowly or fidgety/restless - - 0  Suicidal thoughts - - 0  PHQ-9 Score - - 12  Difficult doing work/chores - - Somewhat difficult       Outpatient Encounter Medications as of 10/12/2018  Medication Sig  . atorvastatin (LIPITOR) 40 MG tablet Take 1 tablet (40 mg total) by mouth daily.  Marland Kitchen escitalopram (LEXAPRO) 20 MG tablet Take 1 tablet (20 mg total) by mouth daily.  . valACYclovir (VALTREX) 1000 MG tablet Take 1 tablet (1,000 mg total) by mouth 3 (three) times daily.       New complaints: None today  Social history: Lives alone in [place]   Review of Systems  Constitutional: Negative for activity change and appetite change.  HENT: Negative.   Eyes: Negative for pain.  Respiratory: Negative for shortness of breath.   Cardiovascular: Negative for chest pain, palpitations and leg swelling.  Gastrointestinal: Negative for abdominal pain.  Endocrine: Negative for polydipsia.  Genitourinary: Negative.   Skin: Negative for rash.  Neurological: Negative for dizziness, weakness and headaches.  Hematological: Does not bruise/bleed easily.  Psychiatric/Behavioral: Negative.   All other systems reviewed and are negative.        Objective:   Physical Exam  Constitutional: She is oriented to person, place, and time. She appears well-developed and well-nourished. No distress.  HENT:  Head: Normocephalic.  Nose: Nose normal.  Mouth/Throat: Oropharynx is clear and moist.  Eyes: Pupils are equal, round, and reactive to light. EOM are normal.  Neck: Normal range of motion. Neck supple. No JVD present. Carotid bruit is not present.  Cardiovascular: Normal rate, regular rhythm, normal heart sounds and intact distal pulses.  Pulmonary/Chest: Effort normal and breath sounds normal. No respiratory distress. She has no wheezes. She has no rales. She exhibits no tenderness.  Abdominal: Soft. Normal appearance, normal aorta and bowel sounds are normal. She exhibits no distension, no abdominal bruit, no pulsatile midline mass and no mass. There is no splenomegaly or hepatomegaly. There is no tenderness.  Musculoskeletal: Normal range of motion. She exhibits no edema.  Lymphadenopathy:    She has no cervical adenopathy.  Neurological: She is alert and oriented to person, place, and time. She has normal reflexes.  Skin: Skin is warm and dry.  Psychiatric: She has a normal mood and affect. Her behavior is normal. Judgment and thought content normal.  Nursing note and vitals reviewed.   BP 122/68   Pulse 75   Temp (!) 97 F (36.1 C) (Oral)   Ht '5\' 6"'  (1.676 m)   Wt 209 lb (94.8 kg)   BMI 33.73 kg/m  Assessment & Plan:  Silva Aamodt comes in today with chief complaint of Medical Management of Chronic Issues   Diagnosis and orders addressed:  1. Hyperlipidemia with target LDL less than 100 Low fat diet - Lipid panel - atorvastatin (LIPITOR) 40 MG tablet; Take 1 tablet (40 mg total) by mouth daily.  Dispense: 90 tablet; Refill: 0 - CMP14+EGFR  2. BMI 33.0-33.9,adult Discussed diet and exercise for person with BMI >25 Will recheck weight in 3-6 months  3. Recurrent major depressive disorder, in full  remission ([place]) Stress management - escitalopram (LEXAPRO) 20 MG tablet; Take 1 tablet (20 mg total) by mouth daily.  Dispense: 90 tablet; Refill: 0  4. Sleep apnea Ordered full night titration study    Labs pending Health Maintenance reviewed Diet and exercise encouraged  Follow up plan: 6 months   [place], FNP

## 2018-10-15 ENCOUNTER — Telehealth: Payer: Self-pay | Admitting: Nurse Practitioner

## 2018-10-15 NOTE — Telephone Encounter (Signed)
Just needs to do otc treatment- mucinex d, humidifier - if no better next week let me know.

## 2018-10-15 NOTE — Telephone Encounter (Signed)
Patient aware of recommendations.  

## 2018-10-15 NOTE — Telephone Encounter (Signed)
PT states that she thinks she has sinus infection, head is clogged up, green mucus, cough, denies fever. Was in here earlier this week for 6 month recheck, was having some sinus drainage then. Pharmacy [facility].

## 2018-10-18 ENCOUNTER — Telehealth: Payer: Self-pay | Admitting: Nurse Practitioner

## 2018-10-18 MED ORDER — AZITHROMYCIN 250 MG PO TABS: ORAL_TABLET | ORAL | 0 refills | Status: DC

## 2018-10-18 NOTE — Telephone Encounter (Signed)
zpak rx sent to pharmacy

## 2018-10-18 NOTE — Telephone Encounter (Signed)
What symptoms do you have? Pt was here Tuesday 10/12/18 and then called Friday 10/15/18 for medical advice. Not doing no better. The mucinex did help it loosen the congestion but wants a rx  How long have you been sick? 10/15/2018  Have you been seen for this problem? no  If your provider decides to give you a prescription, which pharmacy would you like for it to be sent to? [facility]   Patient informed that this information will be sent to the clinical staff for review and that they should receive a follow up call.

## 2018-10-18 NOTE — Telephone Encounter (Signed)
Left message to call back  

## 2018-10-22 NOTE — Telephone Encounter (Signed)
Phone call to patient, no answer, no voicemail.

## 2018-10-25 NOTE — Telephone Encounter (Signed)
Unable to reach patient, encounter closed °

## 2018-11-06 ENCOUNTER — Other Ambulatory Visit: Payer: Self-pay | Admitting: Nurse Practitioner

## 2018-11-06 DIAGNOSIS — B009 Herpesviral infection, unspecified: Secondary | ICD-10-CM

## 2019-02-08 ENCOUNTER — Other Ambulatory Visit: Payer: Self-pay | Admitting: Nurse Practitioner

## 2019-02-08 ENCOUNTER — Other Ambulatory Visit: Payer: Self-pay | Admitting: *Deleted

## 2019-02-08 DIAGNOSIS — F3342 Major depressive disorder, recurrent, in full remission: Secondary | ICD-10-CM

## 2019-02-08 DIAGNOSIS — B009 Herpesviral infection, unspecified: Secondary | ICD-10-CM

## 2019-02-08 MED ORDER — ESCITALOPRAM OXALATE 20 MG PO TABS: 20.0000 mg | ORAL_TABLET | Freq: Every day | ORAL | 0 refills | Status: DC

## 2019-02-08 NOTE — Telephone Encounter (Signed)
10/12/18 OV rtc 24mos

## 2019-02-11 ENCOUNTER — Telehealth: Payer: Self-pay | Admitting: Neurology

## 2019-02-11 NOTE — Telephone Encounter (Signed)
Called the patient and this is her work number and the patient was not at work. I then attempted to call her sister on the [facility]. There was no answer. LVM informing her sister that our office is closing on Monday and if she had another number to contact her and inform her of the office closing. I will also attempt to reach out another time to the patient.

## 2019-02-14 ENCOUNTER — Ambulatory Visit: Payer: BLUE CROSS/BLUE SHIELD | Admitting: Neurology

## 2019-02-14 ENCOUNTER — Telehealth: Payer: Self-pay | Admitting: Neurology

## 2019-02-14 NOTE — Telephone Encounter (Signed)
I attempted to call once more for the patient at the only number that we have on file which is her work number. They were unable to provide another number to contact her on or contact her on our behalf. I have been unable to contact this patient to inform of the office closing today.

## 2019-04-01 ENCOUNTER — Other Ambulatory Visit: Payer: Self-pay | Admitting: Nurse Practitioner

## 2019-04-01 DIAGNOSIS — E785 Hyperlipidemia, unspecified: Secondary | ICD-10-CM

## 2019-04-12 ENCOUNTER — Ambulatory Visit (INDEPENDENT_AMBULATORY_CARE_PROVIDER_SITE_OTHER): Payer: BLUE CROSS/BLUE SHIELD | Admitting: Nurse Practitioner

## 2019-04-12 ENCOUNTER — Encounter: Payer: Self-pay | Admitting: Nurse Practitioner

## 2019-04-12 ENCOUNTER — Other Ambulatory Visit: Payer: Self-pay

## 2019-04-12 DIAGNOSIS — B009 Herpesviral infection, unspecified: Secondary | ICD-10-CM

## 2019-04-12 DIAGNOSIS — Z6833 Body mass index (BMI) 33.0-33.9, adult: Secondary | ICD-10-CM

## 2019-04-12 DIAGNOSIS — E785 Hyperlipidemia, unspecified: Secondary | ICD-10-CM | POA: Diagnosis not present

## 2019-04-12 DIAGNOSIS — F3342 Major depressive disorder, recurrent, in full remission: Secondary | ICD-10-CM

## 2019-04-12 MED ORDER — VALACYCLOVIR HCL 1 G PO TABS: 1000.0000 mg | ORAL_TABLET | Freq: Three times a day (TID) | ORAL | 0 refills | Status: DC

## 2019-04-12 MED ORDER — ATORVASTATIN CALCIUM 40 MG PO TABS: 40.0000 mg | ORAL_TABLET | Freq: Every day | ORAL | 1 refills | Status: DC

## 2019-04-12 MED ORDER — ESCITALOPRAM OXALATE 20 MG PO TABS: 20.0000 mg | ORAL_TABLET | Freq: Every day | ORAL | 1 refills | Status: DC

## 2019-04-12 NOTE — Progress Notes (Signed)
Virtual Visit via telephone Note  I connected with Mikayla Knight on 04/12/19 at 8:15 AM by telephone and verified that I am speaking with the correct person using two identifiers. Mikayla Knight is currently located at home and no one is currently with her during visit. The provider, [name], FNP is located in their office at time of visit.  I discussed the limitations, risks, security and privacy concerns of performing an evaluation and management service by telephone and the availability of in person appointments. I also discussed with the patient that there may be a patient responsible charge related to this service. The patient expressed understanding and agreed to proceed.   History and Present Illness:   Chief Complaint: Medical Management of Chronic Issues    HPI:  1. Hyperlipidemia with target LDL less than 100 Does not watch diet and does no exercise.  2. Recurrent major depressive disorder, in full remission (Mikayla Knight) Is on lexapro and is doing well. Actually her depression is better since she has been out of work.  3. HSV-1 (herpes simplex virus 1) infection Had outbreak abiut 6 months ago but was not bad.  4. BMI 33.0-33.9,adult No recent weight changes.    Outpatient Encounter Medications as of 04/12/2019  Medication Sig  . atorvastatin (LIPITOR) 40 MG tablet Take 1 tablet (40 mg total) by mouth daily. Needs to be seen for further refills  . azithromycin (ZITHROMAX Z-PAK) 250 MG tablet As directed  . escitalopram (LEXAPRO) 20 MG tablet Take 1 tablet (20 mg total) by mouth daily.  . valACYclovir (VALTREX) 1000 MG tablet TAKE 1 TABLET BY MOUTH THREE TIMES DAILY       New complaints: None today  Social history: Lives by herself in [place]     Review of Systems  Constitutional: Negative for diaphoresis and weight loss.  Eyes: Negative for blurred vision, double vision and pain.  Respiratory: Negative for shortness of breath.    Cardiovascular: Negative for chest pain, palpitations, orthopnea and leg swelling.  Gastrointestinal: Negative for abdominal pain.  Skin: Negative for rash.  Neurological: Negative for dizziness, sensory change, loss of consciousness, weakness and headaches.  Endo/Heme/Allergies: Negative for polydipsia. Does not bruise/bleed easily.  Psychiatric/Behavioral: Negative for memory loss. The patient does not have insomnia.   All other systems reviewed and are negative.    Observations/Objective: Alert and oriented- answers all questions apporpriately No distress noted in voice  Assessment and Plan: Mikayla Knight comes in today with chief complaint of Medical Management of Chronic Issues   Diagnosis and orders addressed:  1. Hyperlipidemia with target LDL less than 100 Low fat diet and exercsie - atorvastatin (LIPITOR) 40 MG tablet; Take 1 tablet (40 mg total) by mouth daily. Needs to be seen for further refills  Dispense: 90 tablet; Refill: 1  2. Recurrent major depressive disorder, in full remission (Mikayla Knight) Stress management - escitalopram (LEXAPRO) 20 MG tablet; Take 1 tablet (20 mg total) by mouth daily.  Dispense: 90 tablet; Refill: 1  3. HSV-1 (herpes simplex virus 1) infection - valACYclovir (VALTREX) 1000 MG tablet; Take 1 tablet (1,000 mg total) by mouth 3 (three) times daily.  Dispense: 30 tablet; Refill: 0  4. BMI 33.0-33.9,adult Discussed diet and exercise for person with BMI >25 Will recheck weight in 3-6 months   Labs pending Health Maintenance reviewed Diet and exercise encouraged  Follow up plan: 3 months     I discussed the assessment and treatment plan with the patient. The patient was provided an  opportunity to ask questions and all were answered. The patient agreed with the plan and demonstrated an understanding of the instructions.   The patient was advised to call back or seek an in-person evaluation if the symptoms worsen or if the condition fails to  improve as anticipated.  The above assessment and management plan was discussed with the patient. The patient verbalized understanding of and has agreed to the management plan. Patient is aware to call the clinic if symptoms persist or worsen. Patient is aware when to return to the clinic for a follow-up visit. Patient educated on when it is appropriate to go to the emergency department.   Time call ended:  8:35  I provided 20 minutes of non-face-to-face time during this encounter.    [name], FNP

## 2019-07-13 ENCOUNTER — Ambulatory Visit (INDEPENDENT_AMBULATORY_CARE_PROVIDER_SITE_OTHER): Payer: BC Managed Care – PPO | Admitting: Family Medicine

## 2019-07-13 DIAGNOSIS — N3000 Acute cystitis without hematuria: Secondary | ICD-10-CM

## 2019-07-13 MED ORDER — CEPHALEXIN 500 MG PO CAPS: 500.0000 mg | ORAL_CAPSULE | Freq: Two times a day (BID) | ORAL | 0 refills | Status: AC

## 2019-07-13 MED ORDER — FLUCONAZOLE 150 MG PO TABS: 150.0000 mg | ORAL_TABLET | Freq: Once | ORAL | 0 refills | Status: AC

## 2019-07-13 NOTE — Patient Instructions (Signed)
Urinary Tract Infection, Adult A urinary tract infection (UTI) is an infection of any part of the urinary tract. The urinary tract includes:  The kidneys.  The ureters.  The bladder.  The urethra. These organs make, store, and get rid of pee (urine) in the body. What are the causes? This is caused by germs (bacteria) in your genital area. These germs grow and cause swelling (inflammation) of your urinary tract. What increases the risk? You are more likely to develop this condition if:  You have a small, thin tube (catheter) to drain pee.  You cannot control when you pee or poop (incontinence).  You are female, and: ? You use these methods to prevent pregnancy: ? A medicine that kills sperm (spermicide). ? A device that blocks sperm (diaphragm). ? You have low levels of a female hormone (estrogen). ? You are pregnant.  You have genes that add to your risk.  You are sexually active.  You take antibiotic medicines.  You have trouble peeing because of: ? A prostate that is bigger than normal, if you are female. ? A blockage in the part of your body that drains pee from the bladder (urethra). ? A kidney stone. ? A nerve condition that affects your bladder (neurogenic bladder). ? Not getting enough to drink. ? Not peeing often enough.  You have other conditions, such as: ? Diabetes. ? A weak disease-fighting system (immune system). ? Sickle cell disease. ? Gout. ? Injury of the spine. What are the signs or symptoms? Symptoms of this condition include:  Needing to pee right away (urgently).  Peeing often.  Peeing small amounts often.  Pain or burning when peeing.  Blood in the pee.  Pee that smells bad or not like normal.  Trouble peeing.  Pee that is cloudy.  Fluid coming from the vagina, if you are female.  Pain in the belly or lower back. Other symptoms include:  Throwing up (vomiting).  No urge to eat.  Feeling mixed up (confused).  Being tired  and grouchy (irritable).  A fever.  Watery poop (diarrhea). How is this treated? This condition may be treated with:  Antibiotic medicine.  Other medicines.  Drinking enough water. Follow these instructions at home:  Medicines  Take over-the-counter and prescription medicines only as told by your doctor.  If you were prescribed an antibiotic medicine, take it as told by your doctor. Do not stop taking it even if you start to feel better. General instructions  Make sure you: ? Pee until your bladder is empty. ? Do not hold pee for a long time. ? Empty your bladder after sex. ? Wipe from front to back after pooping if you are a female. Use each tissue one time when you wipe.  Drink enough fluid to keep your pee pale yellow.  Keep all follow-up visits as told by your doctor. This is important. Contact a doctor if:  You do not get better after 1-2 days.  Your symptoms go away and then come back. Get help right away if:  You have very bad back pain.  You have very bad pain in your lower belly.  You have a fever.  You are sick to your stomach (nauseous).  You are throwing up. Summary  A urinary tract infection (UTI) is an infection of any part of the urinary tract.  This condition is caused by germs in your genital area.  There are many risk factors for a UTI. These include having a small, thin   tube to drain pee and not being able to control when you pee or poop.  Treatment includes antibiotic medicines for germs.  Drink enough fluid to keep your pee pale yellow. This information is not intended to replace advice given to you by your health care provider. Make sure you discuss any questions you have with your health care provider. Document Released: 04/28/2008 Document Revised: 10/28/2018 Document Reviewed: 05/20/2018 Elsevier Patient Education  2020 Else[employer] Inc[employer]  

## 2019-07-13 NOTE — Progress Notes (Signed)
Telephone visit  Subjective: CC:? UTI PCP: Chevis Pretty, FNP EHU:[id number] Mikayla Knight is a 68 y.o. female calls for telephone consult today. Patient provides verbal consent for consult held via phone.  Location of patient: work Location of provider: [facility] Others present for call: none  1. Urinary symptoms Patient reports a 2 week h/o chills, foggy urine, dysuria .  She reports urinary frequency, urgency, and now decreased urine output.  She reports occasional nausea and low back pain.  Denies hematuria, fevers, abdominal pain, vomiting, vaginal discharge.  Patient has used AZO cranberry capsules for symptoms with no significant improvement.  Patient denies a h/o frequent or recurrent UTIs.     ROS: Per HPI  No Known Allergies Past Medical History:  Diagnosis Date  . Depression   . Hyperlipidemia   . Internal hemorrhoids     Current Outpatient Medications:  .  atorvastatin (LIPITOR) 40 MG tablet, Take 1 tablet (40 mg total) by mouth daily. Needs to be seen for further refills, Disp: 90 tablet, Rfl: 1 .  escitalopram (LEXAPRO) 20 MG tablet, Take 1 tablet (20 mg total) by mouth daily., Disp: 90 tablet, Rfl: 1 .  valACYclovir (VALTREX) 1000 MG tablet, Take 1 tablet (1,000 mg total) by mouth 3 (three) times daily., Disp: 30 tablet, Rfl: 0  Assessment/ Plan: 68 y.o. female   1. Acute cystitis without hematuria Empiric treatment with oral antibiotics sent.  We discussed staying hydrated and red flag signs and symptoms warranting further evaluation emergency department.  She voiced good understanding.  She has follow-up with PCP at the end of the month at which time she want to discuss nail fungus treatments further with her. - cephALEXin (KEFLEX) 500 MG capsule; Take 1 capsule (500 mg total) by mouth 2 (two) times daily for 7 days.  Dispense: 14 capsule; Refill: 0 - fluconazole (DIFLUCAN) 150 MG tablet; Take 1 tablet (150 mg total) by mouth once for 1 dose.  Dispense: 1 tablet; Refill:  0   Start time: 12:44pm End time: 12:50pm  Total time spent on patient care (including telephone call/ virtual visit): 12 minutes  [place], [place] [phone]

## 2019-07-22 ENCOUNTER — Other Ambulatory Visit: Payer: Self-pay

## 2019-07-25 ENCOUNTER — Other Ambulatory Visit: Payer: Self-pay

## 2019-07-25 ENCOUNTER — Ambulatory Visit: Payer: BC Managed Care – PPO | Admitting: Nurse Practitioner

## 2019-07-25 ENCOUNTER — Encounter: Payer: Self-pay | Admitting: Nurse Practitioner

## 2019-07-25 VITALS — BP 124/71 | HR 79 | Temp 97.1°F | Ht 66.0 in | Wt 207.0 lb

## 2019-07-25 DIAGNOSIS — F3342 Major depressive disorder, recurrent, in full remission: Secondary | ICD-10-CM | POA: Diagnosis not present

## 2019-07-25 DIAGNOSIS — M5441 Lumbago with sciatica, right side: Secondary | ICD-10-CM

## 2019-07-25 DIAGNOSIS — E785 Hyperlipidemia, unspecified: Secondary | ICD-10-CM

## 2019-07-25 DIAGNOSIS — R0683 Snoring: Secondary | ICD-10-CM | POA: Diagnosis not present

## 2019-07-25 DIAGNOSIS — B009 Herpesviral infection, unspecified: Secondary | ICD-10-CM | POA: Diagnosis not present

## 2019-07-25 DIAGNOSIS — Z6833 Body mass index (BMI) 33.0-33.9, adult: Secondary | ICD-10-CM | POA: Diagnosis not present

## 2019-07-25 MED ORDER — ESCITALOPRAM OXALATE 20 MG PO TABS: 20.0000 mg | ORAL_TABLET | Freq: Every day | ORAL | 1 refills | Status: DC

## 2019-07-25 MED ORDER — ATORVASTATIN CALCIUM 40 MG PO TABS: 40.0000 mg | ORAL_TABLET | Freq: Every day | ORAL | 1 refills | Status: DC

## 2019-07-25 MED ORDER — VALACYCLOVIR HCL 1 G PO TABS: 1000.0000 mg | ORAL_TABLET | Freq: Three times a day (TID) | ORAL | 1 refills | Status: DC

## 2019-07-25 NOTE — Progress Notes (Signed)
Subjective:    Patient ID: Mikayla Knight, female    DOB: [birth year], 68 y.o.   MRN: [id number]   Chief Complaint: Medical Management of Chronic Issues    HPI:  1. Hyperlipidemia with target LDL less than100 Does not watch diet and does very little exercise. Mikayla Knight takes lipitor daily  2. Recurrent major depressive disorder, in full remission [facility]) Mikayla Knight currently on lexapro daily. Depression screen [facility] 2/9 07/25/2019 10/12/2018 04/09/2018  Decreased Interest 0 0 0  Down, Depressed, Hopeless 0 0 0  PHQ - 2 Score 0 0 0  Altered sleeping - - -  Tired, decreased energy - - -  Change in appetite - - -  Feeling bad or failure about yourself  - - -  Trouble concentrating - - -  Moving slowly or fidgety/restless - - -  Suicidal thoughts - - -  PHQ-9 Score - - -  Difficult doing work/chores - - -     3. HSV-1 (herpes simplex virus 1) infection Takes valtrex daily and has had no recent outbreaks  4. BMI 33.0-33.9,adult No recent weight changes    Outpatient Encounter Medications as of 07/25/2019  Medication Sig  . atorvastatin (LIPITOR) 40 MG tablet Take 1 tablet (40 mg total) by mouth daily. Needs to be seen for further refills  . escitalopram (LEXAPRO) 20 MG tablet Take 1 tablet (20 mg total) by mouth daily.  . valACYclovir (VALTREX) 1000 MG tablet Take 1 tablet (1,000 mg total) by mouth 3 (three) times daily.     Past Surgical History:  Procedure Laterality Date  . COLONOSCOPY  04-01-2004    Family History  Problem Relation Age of Onset  . Ovarian cancer Mother     New complaints: Patient c/o right sciatica nerve pain. Says hurts when Mikayla Knight walks. Pain starts in her right buttocks and radiates down the back of her leg. Does not radiate down  Very often. Walking increases pain and sitting and laying on heating pad relieves pain.  Social history: Lives in [place]  Controlled substance contract: n/a     Review of Systems  Constitutional: Negative for activity  change and appetite change.  HENT: Negative.   Eyes: Negative for pain.  Respiratory: Negative for shortness of breath.   Cardiovascular: Negative for chest pain, palpitations and leg swelling.  Gastrointestinal: Negative for abdominal pain.  Endocrine: Negative for polydipsia.  Genitourinary: Negative.   Skin: Negative for rash.  Neurological: Negative for dizziness, weakness and headaches.  Hematological: Does not bruise/bleed easily.  Psychiatric/Behavioral: Negative.   All other systems reviewed and are negative.      Objective:   Physical Exam Vitals signs and nursing note reviewed.  Constitutional:      General: Mikayla Knight not in acute distress.    Appearance: Normal appearance. Mikayla Knight well-developed.  HENT:     Head: Normocephalic.     Nose: Nose normal.  Eyes:     Pupils: Pupils are equal, round, and reactive to light.  Neck:     Musculoskeletal: Normal range of motion and neck supple.     Vascular: No carotid bruit or JVD.  Cardiovascular:     Rate and Rhythm: Normal rate and regular rhythm.     Heart sounds: Normal heart sounds.  Pulmonary:     Effort: Pulmonary effort Knight normal. No respiratory distress.     Breath sounds: Normal breath sounds. No wheezing or rales.  Chest:     Chest wall: No tenderness.  Abdominal:  General: Bowel sounds are normal. There Knight no distension or abdominal bruit.     Palpations: Abdomen Knight soft. There Knight no hepatomegaly, splenomegaly, mass or pulsatile mass.     Tenderness: There Knight no abdominal tenderness.  Musculoskeletal: Normal range of motion.  Lymphadenopathy:     Cervical: No cervical adenopathy.  Skin:    General: Skin Knight warm and dry.  Neurological:     Mental Status: Mikayla Knight alert and oriented to person, place, and time.     Deep Tendon Reflexes: Reflexes are normal and symmetric.  Psychiatric:        Behavior: Behavior normal.        Thought Content: Thought content normal.        Judgment: Judgment normal.      BP 124/71   Pulse 79   Temp (!) 97.1 F (36.2 C) (Oral)   Ht 5' 6" (1.676 m)   Wt 207 lb (93.9 kg)   BMI 33.41 kg/m        Assessment & Plan:  Mikayla Knight comes in today with chief complaint of Medical Management of Chronic Issues   Diagnosis and orders addressed:  1. Hyperlipidemia with target LDL less than 100 Low fat diet - CMP14+EGFR - Lipid panel - atorvastatin (LIPITOR) 40 MG tablet; Take 1 tablet (40 mg total) by mouth daily. Needs to be seen for further refills  Dispense: 90 tablet; Refill: 1  2. Recurrent major depressive disorder, in full remission (HCC) Stress management - escitalopram (LEXAPRO) 20 MG tablet; Take 1 tablet (20 mg total) by mouth daily.  Dispense: 90 tablet; Refill: 1  3. HSV-1 (herpes simplex virus 1) infection - valACYclovir (VALTREX) 1000 MG tablet; Take 1 tablet (1,000 mg total) by mouth 3 (three) times daily.  Dispense: 90 tablet; Refill: 1  4. BMI 33.0-33.9,adult Discussed diet and exercise for person with BMI >25 Will recheck weight in 3-6 months  5. Acute right-sided low back pain with right-sided sciatica Moist heat Rest  No liftng, bending or stooping  6. Snores - Ambulatory referral to Sleep Studies   Labs pending Health Maintenance reviewed Diet and exercise encouraged  Follow up plan: 6 months   Mary-Margaret Martin, FNP  

## 2019-07-25 NOTE — Addendum Note (Signed)
Addended by: Chevis Pretty on: 07/25/2019 08:42 AM   Modules accepted: Orders

## 2019-07-25 NOTE — Patient Instructions (Signed)
Acute Back Pain, Adult Acute back pain is sudden and usually short-lived. It is often caused by an injury to the muscles and tissues in the back. The injury may result from:  A muscle or ligament getting overstretched or torn (strained). Ligaments are tissues that connect bones to each other. Lifting something improperly can cause a back strain.  Wear and tear (degeneration) of the spinal disks. Spinal disks are circular tissue that provides cushioning between the bones of the spine (vertebrae).  Twisting motions, such as while playing sports or doing yard work.  A hit to the back.  Arthritis. You may have a physical exam, lab tests, and imaging tests to find the cause of your pain. Acute back pain usually goes away with rest and home care. Follow these instructions at home: Managing pain, stiffness, and swelling  Take over-the-counter and prescription medicines only as told by your health care provider.  Your health care provider may recommend applying ice during the first 24-48 hours after your pain starts. To do this: ? Put ice in a plastic bag. ? Place a towel between your skin and the bag. ? Leave the ice on for 20 minutes, 2-3 times a day.  If directed, apply heat to the affected area as often as told by your health care provider. Use the heat source that your health care provider recommends, such as a moist heat pack or a heating pad. ? Place a towel between your skin and the heat source. ? Leave the heat on for 20-30 minutes. ? Remove the heat if your skin turns bright red. This is especially important if you are unable to feel pain, heat, or cold. You have a greater risk of getting burned. Activity   Do not stay in bed. Staying in bed for more than 1-2 days can delay your recovery.  Sit up and stand up straight. Avoid leaning forward when you sit, or hunching over when you stand. ? If you work at a desk, sit close to it so you do not need to lean over. Keep your chin tucked  in. Keep your neck drawn back, and keep your elbows bent at a right angle. Your arms should look like the letter "L." ? Sit high and close to the steering wheel when you drive. Add lower back (lumbar) support to your car seat, if needed.  Take short walks on even surfaces as soon as you are able. Try to increase the length of time you walk each day.  Do not sit, drive, or stand in one place for more than 30 minutes at a time. Sitting or standing for long periods of time can put stress on your back.  Do not drive or use heavy machinery while taking prescription pain medicine.  Use proper lifting techniques. When you bend and lift, use positions that put less stress on your back: ? Bend your knees. ? Keep the load close to your body. ? Avoid twisting.  Exercise regularly as told by your health care provider. Exercising helps your back heal faster and helps prevent back injuries by keeping muscles strong and flexible.  Work with a physical therapist to make a safe exercise program, as recommended by your health care provider. Do any exercises as told by your physical therapist. Lifestyle  Maintain a healthy weight. Extra weight puts stress on your back and makes it difficult to have good posture.  Avoid activities or situations that make you feel anxious or stressed. Stress and anxiety increase muscle   tension and can make back pain worse. Learn ways to manage anxiety and stress, such as through exercise. General instructions  Sleep on a firm mattress in a comfortable position. Try lying on your side with your knees slightly bent. If you lie on your back, put a pillow under your knees.  Follow your treatment plan as told by your health care provider. This may include: ? Cognitive or behavioral therapy. ? Acupuncture or massage therapy. ? Meditation or yoga. Contact a health care provider if:  You have pain that is not relieved with rest or medicine.  You have increasing pain going down  into your legs or buttocks.  Your pain does not improve after 2 weeks.  You have pain at night.  You lose weight without trying.  You have a fever or chills. Get help right away if:  You develop new bowel or bladder control problems.  You have unusual weakness or numbness in your arms or legs.  You develop nausea or vomiting.  You develop abdominal pain.  You feel faint. Summary  Acute back pain is sudden and usually short-lived.  Use proper lifting techniques. When you bend and lift, use positions that put less stress on your back.  Take over-the-counter and prescription medicines and apply heat or ice as directed by your health care provider. This information is not intended to replace advice given to you by your health care provider. Make sure you discuss any questions you have with your health care provider. Document Released: 11/10/2005 Document Revised: 03/01/2019 Document Reviewed: 06/24/2017 Elsevier Patient Education  2020 Elsevier Inc.  

## 2019-07-26 LAB — CMP14+EGFR
ALT: 18 IU/L (ref 0–32)
AST: 21 IU/L (ref 0–40)
Albumin/Globulin Ratio: 1.6 (ref 1.2–2.2)
Albumin: 4.4 g/dL (ref 3.8–4.8)
Alkaline Phosphatase: 95 IU/L (ref 39–117)
BUN/Creatinine Ratio: 22 (ref 12–28)
BUN: 19 mg/dL (ref 8–27)
Bilirubin Total: 0.4 mg/dL (ref 0.0–1.2)
CO2: 23 mmol/L (ref 20–29)
Calcium: 9.5 mg/dL (ref 8.7–10.3)
Chloride: 103 mmol/L (ref 96–106)
Creatinine, Ser: 0.85 mg/dL (ref 0.57–1.00)
GFR calc Af Amer: 82 mL/min/{1.73_m2} (ref >59)
GFR calc non Af Amer: 71 mL/min/{1.73_m2} (ref >59)
Globulin, Total: 2.7 g/dL (ref 1.5–4.5)
Glucose: 96 mg/dL (ref 65–99)
Potassium: 5 mmol/L (ref 3.5–5.2)
Sodium: 142 mmol/L (ref 134–144)
Total Protein: 7.1 g/dL (ref 6.0–8.5)

## 2019-07-26 LAB — LIPID PANEL
Chol/HDL Ratio: 4.4 ratio (ref 0.0–4.4)
Cholesterol, Total: 212 mg/dL — ABNORMAL HIGH (ref 100–199)
HDL: 48 mg/dL (ref >39)
LDL Chol Calc (NIH): 142 mg/dL — ABNORMAL HIGH (ref 0–99)
Triglycerides: 125 mg/dL (ref 0–149)
VLDL Cholesterol Cal: 22 mg/dL (ref 5–40)

## 2019-07-26 LAB — VITAMIN B12: Vitamin B-12: >2000 pg/mL — ABNORMAL HIGH (ref 232–1245)

## 2019-08-03 ENCOUNTER — Other Ambulatory Visit: Payer: BC Managed Care – PPO

## 2019-08-03 ENCOUNTER — Other Ambulatory Visit: Payer: Self-pay

## 2019-08-03 ENCOUNTER — Other Ambulatory Visit: Payer: Self-pay | Admitting: Nurse Practitioner

## 2019-08-03 DIAGNOSIS — R5383 Other fatigue: Secondary | ICD-10-CM

## 2019-08-04 LAB — CBC WITH DIFFERENTIAL/PLATELET
Basophils Absolute: 0 10*3/uL (ref 0.0–0.2)
Basos: 1 %
EOS (ABSOLUTE): 0.2 10*3/uL (ref 0.0–0.4)
Eos: 4 %
Hematocrit: 37.2 % (ref 34.0–46.6)
Hemoglobin: 12.7 g/dL (ref 11.1–15.9)
Immature Grans (Abs): 0 10*3/uL (ref 0.0–0.1)
Immature Granulocytes: 0 %
Lymphocytes Absolute: 1.9 10*3/uL (ref 0.7–3.1)
Lymphs: 31 %
MCH: 32.2 pg (ref 26.6–33.0)
MCHC: 34.1 g/dL (ref 31.5–35.7)
MCV: 94 fL (ref 79–97)
Monocytes Absolute: 0.4 10*3/uL (ref 0.1–0.9)
Monocytes: 6 %
Neutrophils Absolute: 3.6 10*3/uL (ref 1.4–7.0)
Neutrophils: 58 %
Platelets: 225 10*3/uL (ref 150–450)
RBC: 3.94 x10E6/uL (ref 3.77–5.28)
RDW: 12.4 % (ref 11.7–15.4)
WBC: 6.2 10*3/uL (ref 3.4–10.8)

## 2019-08-04 LAB — THYROID PANEL WITH TSH
Free Thyroxine Index: 1.6 (ref 1.2–4.9)
T3 Uptake Ratio: 21 % — ABNORMAL LOW (ref 24–39)
T4, Total: 7.4 ug/dL (ref 4.5–12.0)
TSH: 2.87 u[IU]/mL (ref 0.450–4.500)

## 2019-08-23 DIAGNOSIS — Z1231 Encounter for screening mammogram for malignant neoplasm of breast: Secondary | ICD-10-CM | POA: Diagnosis not present

## 2019-08-29 NOTE — Telephone Encounter (Signed)
This is was sent to [facility] - She can call [phone] to see where they are with scheduling her.

## 2019-12-22 ENCOUNTER — Other Ambulatory Visit: Payer: Self-pay

## 2019-12-23 ENCOUNTER — Encounter: Payer: Self-pay | Admitting: Nurse Practitioner

## 2019-12-23 ENCOUNTER — Ambulatory Visit: Payer: BC Managed Care – PPO | Admitting: Nurse Practitioner

## 2019-12-23 VITALS — BP 131/74 | HR 88 | Temp 96.2°F | Resp 20 | Ht 66.0 in | Wt 213.0 lb

## 2019-12-23 DIAGNOSIS — Z6833 Body mass index (BMI) 33.0-33.9, adult: Secondary | ICD-10-CM | POA: Diagnosis not present

## 2019-12-23 DIAGNOSIS — E785 Hyperlipidemia, unspecified: Secondary | ICD-10-CM | POA: Diagnosis not present

## 2019-12-23 DIAGNOSIS — F3342 Major depressive disorder, recurrent, in full remission: Secondary | ICD-10-CM

## 2019-12-23 DIAGNOSIS — B009 Herpesviral infection, unspecified: Secondary | ICD-10-CM | POA: Diagnosis not present

## 2019-12-23 DIAGNOSIS — M62830 Muscle spasm of back: Secondary | ICD-10-CM

## 2019-12-23 LAB — CBC WITH DIFFERENTIAL/PLATELET
Basophils Absolute: 0 10*3/uL (ref 0.0–0.2)
Basos: 1 %
EOS (ABSOLUTE): 0.2 10*3/uL (ref 0.0–0.4)
Eos: 2 %
Hematocrit: 40.9 % (ref 34.0–46.6)
Hemoglobin: 13.8 g/dL (ref 11.1–15.9)
Immature Grans (Abs): 0 10*3/uL (ref 0.0–0.1)
Immature Granulocytes: 0 %
Lymphocytes Absolute: 2.1 10*3/uL (ref 0.7–3.1)
Lymphs: 28 %
MCH: 32.9 pg (ref 26.6–33.0)
MCHC: 33.7 g/dL (ref 31.5–35.7)
MCV: 98 fL — ABNORMAL HIGH (ref 79–97)
Monocytes Absolute: 0.4 10*3/uL (ref 0.1–0.9)
Monocytes: 6 %
Neutrophils Absolute: 4.6 10*3/uL (ref 1.4–7.0)
Neutrophils: 63 %
Platelets: 248 10*3/uL (ref 150–450)
RBC: 4.19 x10E6/uL (ref 3.77–5.28)
RDW: 12.3 % (ref 11.7–15.4)
WBC: 7.3 10*3/uL (ref 3.4–10.8)

## 2019-12-23 LAB — CMP14+EGFR
ALT: 20 IU/L (ref 0–32)
AST: 18 IU/L (ref 0–40)
Albumin/Globulin Ratio: 1.7 (ref 1.2–2.2)
Albumin: 4.4 g/dL (ref 3.8–4.8)
Alkaline Phosphatase: 100 IU/L (ref 39–117)
BUN/Creatinine Ratio: 24 (ref 12–28)
BUN: 18 mg/dL (ref 8–27)
Bilirubin Total: 0.3 mg/dL (ref 0.0–1.2)
CO2: 24 mmol/L (ref 20–29)
Calcium: 9.6 mg/dL (ref 8.7–10.3)
Chloride: 104 mmol/L (ref 96–106)
Creatinine, Ser: 0.76 mg/dL (ref 0.57–1.00)
GFR calc Af Amer: 94 mL/min/{1.73_m2} (ref >59)
GFR calc non Af Amer: 81 mL/min/{1.73_m2} (ref >59)
Globulin, Total: 2.6 g/dL (ref 1.5–4.5)
Glucose: 80 mg/dL (ref 65–99)
Potassium: 4.5 mmol/L (ref 3.5–5.2)
Sodium: 141 mmol/L (ref 134–144)
Total Protein: 7 g/dL (ref 6.0–8.5)

## 2019-12-23 LAB — LIPID PANEL
Chol/HDL Ratio: 3.6 ratio (ref 0.0–4.4)
Cholesterol, Total: 170 mg/dL (ref 100–199)
HDL: 47 mg/dL (ref >39)
LDL Chol Calc (NIH): 98 mg/dL (ref 0–99)
Triglycerides: 140 mg/dL (ref 0–149)
VLDL Cholesterol Cal: 25 mg/dL (ref 5–40)

## 2019-12-23 MED ORDER — ESCITALOPRAM OXALATE 20 MG PO TABS: 20.0000 mg | ORAL_TABLET | Freq: Every day | ORAL | 1 refills | Status: DC

## 2019-12-23 MED ORDER — CYCLOBENZAPRINE HCL 10 MG PO TABS: 10.0000 mg | ORAL_TABLET | Freq: Three times a day (TID) | ORAL | 1 refills | Status: DC | PRN

## 2019-12-23 MED ORDER — ATORVASTATIN CALCIUM 40 MG PO TABS: 40.0000 mg | ORAL_TABLET | Freq: Every day | ORAL | 1 refills | Status: DC

## 2019-12-23 MED ORDER — VALACYCLOVIR HCL 1 G PO TABS: 1000.0000 mg | ORAL_TABLET | Freq: Three times a day (TID) | ORAL | 1 refills | Status: DC

## 2019-12-23 NOTE — Patient Instructions (Signed)
Muscle Cramps and Spasms Muscle cramps and spasms are when muscles tighten by themselves. They usually get better within minutes. Muscle cramps are painful. They are usually stronger and last longer than muscle spasms. Muscle spasms may or may not be painful. They can last a few seconds or much longer. Cramps and spasms can affect any muscle, but they occur most often in the calf muscles of the leg. They are usually not caused by a serious problem. In many cases, the cause is not known. Some common causes include:  Doing more physical work or exercise than your body is ready for.  Using the muscles too much (overuse) by repeating certain movements too many times.  Staying in a certain position for a long time.  Playing a sport or doing an activity without preparing properly.  Using bad form or technique while playing a sport or doing an activity.  Not having enough water in your body (dehydration).  Injury.  Side effects of some medicines.  Low levels of the salts and minerals in your blood (electrolytes), such as low potassium or calcium. Follow these instructions at home: Managing pain and stiffness      Massage, stretch, and relax the muscle. Do this for many minutes at a time.  If told, put heat on tight or tense muscles as often as told by your doctor. Use the heat source that your doctor recommends, such as a moist heat pack or a heating pad. ? Place a towel between your skin and the heat source. ? Leave the heat on for 20-30 minutes. ? Remove the heat if your skin turns bright red. This is very important if you are not able to feel pain, heat, or cold. You may have a greater risk of getting burned.  If told, put ice on the affected area. This may help if you are sore or have pain after a cramp or spasm. ? Put ice in a plastic bag. ? Place a towel between your skin and the bag. ? Leave the ice on for 20 minutes, 2-3 times a day.  Try taking hot showers or baths to help  relax tight muscles. Eating and drinking  Drink enough fluid to keep your pee (urine) pale yellow.  Eat a healthy diet to help ensure that your muscles work well. This should include: ? Fruits and vegetables. ? Lean protein. ? Whole grains. ? Low-fat or nonfat dairy products. General instructions  If you are having cramps often, avoid intense exercise for several days.  Take over-the-counter and prescription medicines only as told by your doctor.  Watch for any changes in your symptoms.  Keep all follow-up visits as told by your doctor. This is important. Contact a doctor if:  Your cramps or spasms get worse or happen more often.  Your cramps or spasms do not get better with time. Summary  Muscle cramps and spasms are when muscles tighten by themselves. They usually get better within minutes.  Cramps and spasms occur most often in the calf muscles of the leg.  Massage, stretch, and relax the muscle. This may help the cramp or spasm go away.  Drink enough fluid to keep your pee (urine) pale yellow. This information is not intended to replace advice given to you by your health care provider. Make sure you discuss any questions you have with your health care provider. Document Revised: 04/05/2018 Document Reviewed: 04/05/2018 Elsevier Patient Education  2020 Elsevier Inc.  

## 2019-12-23 NOTE — Progress Notes (Signed)
Subjective:    Patient ID: Mikayla Knight, female    DOB: 1951/10/19, 69 y.o.   MRN: [id number]   Chief Complaint: Medical Management of Chronic Issues    HPI:  1. Recurrent major depressive disorder, in full remission [facility]) She is currently on lexapro and is doing well. Depression screen [facility] 2/9 12/23/2019 07/25/2019 10/12/2018  Decreased Interest 0 0 0  Down, Depressed, Hopeless 0 0 0  PHQ - 2 Score 0 0 0  Altered sleeping - - -  Tired, decreased energy - - -  Change in appetite - - -  Feeling bad or failure about yourself  - - -  Trouble concentrating - - -  Moving slowly or fidgety/restless - - -  Suicidal thoughts - - -  PHQ-9 Score - - -  Difficult doing work/chores - - -     2. Hyperlipidemia with target LDL less than 100 Does not really watch diet and does very little exercise. Lab Results  Component Value Date   CHOL 212 (H) 07/25/2019   HDL 48 07/25/2019   LDLCALC 142 (H) 07/25/2019   TRIG 125 07/25/2019   CHOLHDL 4.4 07/25/2019     3. HSV-2 (herpes simplex virus 1) infection Takes valtrex daily.   4. BMI 33.0-33.9,adult No recent weight changes. Wt Readings from Last 3 Encounters:  07/25/19 207 lb (93.9 kg)  10/12/18 209 lb (94.8 kg)  04/09/18 207 lb (93.9 kg)   BMI Readings from Last 3 Encounters:  07/25/19 33.41 kg/m  10/12/18 33.73 kg/m  04/09/18 33.41 kg/m      Outpatient Encounter Medications as of 12/23/2019  Medication Sig  . atorvastatin (LIPITOR) 40 MG tablet Take 1 tablet (40 mg total) by mouth daily. Needs to be seen for further refills  . escitalopram (LEXAPRO) 20 MG tablet Take 1 tablet (20 mg total) by mouth daily.  . valACYclovir (VALTREX) 1000 MG tablet Take 1 tablet (1,000 mg total) by mouth 3 (three) times daily.     Past Surgical History:  Procedure Laterality Date  . COLONOSCOPY  04-01-2004    Family History  Problem Relation Age of Onset  . Ovarian cancer Mother     New complaints: Has back spasms 1-2 x a  week. Not sure what is causing episodes.  Social history: Lives by herself in [place]  Controlled substance contract: n/a    Review of Systems  Constitutional: Negative for diaphoresis.  Eyes: Negative for pain.  Respiratory: Negative for shortness of breath.   Cardiovascular: Negative for chest pain, palpitations and leg swelling.  Gastrointestinal: Negative for abdominal pain.  Endocrine: Negative for polydipsia.  Skin: Negative for rash.  Neurological: Negative for dizziness, weakness and headaches.  Hematological: Does not bruise/bleed easily.  All other systems reviewed and are negative.      Objective:   Physical Exam Vitals and nursing note reviewed.  Constitutional:      General: She is not in acute distress.    Appearance: Normal appearance. She is well-developed.  HENT:     Head: Normocephalic.     Nose: Nose normal.  Eyes:     Pupils: Pupils are equal, round, and reactive to light.  Neck:     Vascular: No carotid bruit or JVD.  Cardiovascular:     Rate and Rhythm: Normal rate and regular rhythm.     Heart sounds: Normal heart sounds.  Pulmonary:     Effort: Pulmonary effort is normal. No respiratory distress.     Breath sounds: Normal  breath sounds. No wheezing or rales.  Chest:     Chest wall: No tenderness.  Abdominal:     General: Bowel sounds are normal. There is no distension or abdominal bruit.     Palpations: Abdomen is soft. There is no hepatomegaly, splenomegaly, mass or pulsatile mass.     Tenderness: There is no abdominal tenderness.  Musculoskeletal:        General: Normal range of motion.     Cervical back: Normal range of motion and neck supple.  Lymphadenopathy:     Cervical: No cervical adenopathy.  Skin:    General: Skin is warm and dry.  Neurological:     Mental Status: She is alert and oriented to person, place, and time.     Deep Tendon Reflexes: Reflexes are normal and symmetric.  Psychiatric:        Behavior: Behavior  normal.        Thought Content: Thought content normal.        Judgment: Judgment normal.     BP 131/74   Pulse 88   Temp (!) 96.2 F (35.7 C) (Temporal)   Resp 20   Ht '5\' 6"'  (1.676 m)   Wt 213 lb (96.6 kg)   SpO2 95%   BMI 34.38 kg/m        Assessment & Plan:  Mikayla Knight comes in today with chief complaint of Back Pain   Diagnosis and orders addressed:  1. Recurrent major depressive disorder, in full remission ([place]) Low sodium diet - escitalopram (LEXAPRO) 20 MG tablet; Take 1 tablet (20 mg total) by mouth daily.  Dispense: 90 tablet; Refill: 1  2. Hyperlipidemia with target LDL less than 100 Low fat diet - atorvastatin (LIPITOR) 40 MG tablet; Take 1 tablet (40 mg total) by mouth daily. Needs to be seen for further refills  Dispense: 90 tablet; Refill: 1 - CBC with Differential/Platelet - CMP14+EGFR - Lipid panel  3. HSV-2 (herpes simplex virus 2) infection Continue valtrex as rx - valACYclovir (VALTREX) 1000 MG tablet; Take 1 tablet (1,000 mg total) by mouth 3 (three) times daily.  Dispense: 90 tablet; Refill: 1   4. BMI 33.0-33.9,adult Discussed diet and exercise for person with BMI >25 Will recheck weight in 3-6 months  5. Back spasm Moist heat Sedation precautions with flexeril - cyclobenzaprine (FLEXERIL) 10 MG tablet; Take 1 tablet (10 mg total) by mouth 3 (three) times daily as needed for muscle spasms.  Dispense: 30 tablet; Refill: 1  Labs pending Health Maintenance reviewed Diet and exercise encouraged  Follow up plan: 6 months   [name], FNP

## 2020-01-24 ENCOUNTER — Ambulatory Visit: Payer: Self-pay | Admitting: Nurse Practitioner

## 2020-01-26 DIAGNOSIS — Z23 Encounter for immunization: Secondary | ICD-10-CM | POA: Diagnosis not present

## 2020-03-02 DIAGNOSIS — Z23 Encounter for immunization: Secondary | ICD-10-CM | POA: Diagnosis not present

## 2020-03-12 ENCOUNTER — Encounter: Payer: Self-pay | Admitting: Nurse Practitioner

## 2020-03-12 ENCOUNTER — Encounter: Payer: Self-pay | Admitting: Family Medicine

## 2020-03-12 ENCOUNTER — Ambulatory Visit (INDEPENDENT_AMBULATORY_CARE_PROVIDER_SITE_OTHER): Payer: BC Managed Care – PPO | Admitting: Family Medicine

## 2020-03-12 DIAGNOSIS — A6920 Lyme disease, unspecified: Secondary | ICD-10-CM | POA: Diagnosis not present

## 2020-03-12 DIAGNOSIS — L532 Erythema marginatum: Secondary | ICD-10-CM

## 2020-03-12 DIAGNOSIS — W57XXXA Bitten or stung by nonvenomous insect and other nonvenomous arthropods, initial encounter: Secondary | ICD-10-CM | POA: Diagnosis not present

## 2020-03-12 DIAGNOSIS — R238 Other skin changes: Secondary | ICD-10-CM | POA: Diagnosis not present

## 2020-03-12 MED ORDER — DOXYCYCLINE HYCLATE 100 MG PO CAPS: 100.0000 mg | ORAL_CAPSULE | Freq: Two times a day (BID) | ORAL | 0 refills | Status: DC

## 2020-03-12 NOTE — Progress Notes (Signed)
Subjective:    Patient ID: Mikayla Knight, female    DOB: 1951-11-23, 69 y.o.   MRN: [id number]   HPI: Mikayla Knight is a 69 y.o. female presenting for tick bite.  She has her second Covid shot on April 9.  She had a tick bite a few days later.  She pulled it off of herself 3 days ago and today notes that she has a bull's-eye rash that is about the size of a dinner plate.  The bull's-eye rash is down near the left hip.  She has not had any fever chills sweats.  No nausea vomiting diarrhea.  She is concerned that there might be some relation to having taken the Covid shot.   Depression screen [facility] 2/9 12/23/2019 07/25/2019 10/12/2018 04/09/2018 09/04/2017  Decreased Interest 0 0 0 0 3  Down, Depressed, Hopeless 0 0 0 0 2  PHQ - 2 Score 0 0 0 0 5  Altered sleeping - - - - 2  Tired, decreased energy - - - - 3  Change in appetite - - - - 0  Feeling bad or failure about yourself  - - - - 0  Trouble concentrating - - - - 2  Moving slowly or fidgety/restless - - - - 0  Suicidal thoughts - - - - 0  PHQ-9 Score - - - - 12  Difficult doing work/chores - - - - Somewhat difficult     Relevant past medical, surgical, family and social history reviewed and updated as indicated.  Interim medical history since our last visit reviewed. Allergies and medications reviewed and updated.  ROS:  Review of Systems  Constitutional: Negative.   HENT: Negative.   Eyes: Negative for visual disturbance.  Respiratory: Negative for shortness of breath.   Cardiovascular: Negative for chest pain.  Gastrointestinal: Negative for abdominal pain.  Musculoskeletal: Negative for arthralgias.     Social History   Tobacco Use  Smoking Status Never Smoker  Smokeless Tobacco Never Used       Objective:     Wt Readings from Last 3 Encounters:  12/23/19 213 lb (96.6 kg)  07/25/19 207 lb (93.9 kg)  10/12/18 209 lb (94.8 kg)     Exam deferred. Pt. Harboring due to COVID 19. Phone visit performed.    Assessment & Plan:   1. Lyme disease   2. Erythema marginatum     Meds ordered this encounter  Medications  . doxycycline (VIBRAMYCIN) 100 MG capsule    Sig: Take 1 capsule (100 mg total) by mouth 2 (two) times daily.    Dispense:  20 capsule    Refill:  0    No orders of the defined types were placed in this encounter.     Diagnoses and all orders for this visit:  Lyme disease  Erythema marginatum  Other orders -     doxycycline (VIBRAMYCIN) 100 MG capsule; Take 1 capsule (100 mg total) by mouth 2 (two) times daily.    Virtual Visit via telephone Note  I discussed the limitations, risks, security and privacy concerns of performing an evaluation and management service by telephone and the availability of in person appointments. The patient was identified with two identifiers. Pt.expressed understanding and agreed to proceed. Pt. Is at home. Dr. Livia Snellen is in his office.  Follow Up Instructions:   I discussed the assessment and treatment plan with the patient. The patient was provided an opportunity to ask questions and all were answered. The patient agreed with  the plan and demonstrated an understanding of the instructions.   The patient was advised to call back or seek an in-person evaluation if the symptoms worsen or if the condition fails to improve as anticipated.   Total minutes including chart review and phone contact time: 17   Follow up plan: Return if symptoms worsen or fail to improve.  Mikayla Fraise, MD [place]

## 2020-06-05 ENCOUNTER — Other Ambulatory Visit: Payer: Self-pay | Admitting: Nurse Practitioner

## 2020-06-05 DIAGNOSIS — B009 Herpesviral infection, unspecified: Secondary | ICD-10-CM

## 2020-06-22 ENCOUNTER — Encounter: Payer: Self-pay | Admitting: Nurse Practitioner

## 2020-06-22 ENCOUNTER — Other Ambulatory Visit: Payer: Self-pay

## 2020-06-22 ENCOUNTER — Other Ambulatory Visit: Payer: Self-pay | Admitting: Nurse Practitioner

## 2020-06-22 ENCOUNTER — Ambulatory Visit: Payer: BC Managed Care – PPO | Admitting: Nurse Practitioner

## 2020-06-22 VITALS — BP 129/73 | HR 75 | Temp 97.5°F | Resp 20 | Ht 66.0 in | Wt 208.0 lb

## 2020-06-22 DIAGNOSIS — L2084 Intrinsic (allergic) eczema: Secondary | ICD-10-CM

## 2020-06-22 DIAGNOSIS — R5382 Chronic fatigue, unspecified: Secondary | ICD-10-CM | POA: Diagnosis not present

## 2020-06-22 DIAGNOSIS — W57XXXS Bitten or stung by nonvenomous insect and other nonvenomous arthropods, sequela: Secondary | ICD-10-CM | POA: Diagnosis not present

## 2020-06-22 DIAGNOSIS — B009 Herpesviral infection, unspecified: Secondary | ICD-10-CM

## 2020-06-22 DIAGNOSIS — F3342 Major depressive disorder, recurrent, in full remission: Secondary | ICD-10-CM | POA: Diagnosis not present

## 2020-06-22 DIAGNOSIS — Z6833 Body mass index (BMI) 33.0-33.9, adult: Secondary | ICD-10-CM | POA: Diagnosis not present

## 2020-06-22 DIAGNOSIS — Z1231 Encounter for screening mammogram for malignant neoplasm of breast: Secondary | ICD-10-CM

## 2020-06-22 DIAGNOSIS — E785 Hyperlipidemia, unspecified: Secondary | ICD-10-CM | POA: Diagnosis not present

## 2020-06-22 MED ORDER — VALACYCLOVIR HCL 1 G PO TABS: 1000.0000 mg | ORAL_TABLET | Freq: Three times a day (TID) | ORAL | 1 refills | Status: DC

## 2020-06-22 MED ORDER — ESCITALOPRAM OXALATE 20 MG PO TABS: 20.0000 mg | ORAL_TABLET | Freq: Every day | ORAL | 1 refills | Status: DC

## 2020-06-22 MED ORDER — ATORVASTATIN CALCIUM 40 MG PO TABS: 40.0000 mg | ORAL_TABLET | Freq: Every day | ORAL | 1 refills | Status: DC

## 2020-06-22 MED ORDER — TRIAMCINOLONE 0.1 % CREAM:EUCERIN CREAM 1:1: 1.0000 "application " | TOPICAL_CREAM | Freq: Two times a day (BID) | CUTANEOUS | 2 refills | Status: DC

## 2020-06-22 NOTE — Progress Notes (Signed)
Subjective:    Patient ID: Mikayla Knight, female    DOB: 07-18-1951, 69 y.o.   MRN: [id number]   Chief Complaint: Medical Management of Chronic Issues    HPI:  1. Hyperlipidemia with target LDL less than 100 Tries to watch diet but does little to no exercise. Lab Results  Component Value Date   CHOL 170 12/23/2019   HDL 47 12/23/2019   LDLCALC 98 12/23/2019   TRIG 140 12/23/2019   CHOLHDL 3.6 12/23/2019   The 10-year ASCVD risk score Mikayla Knight., et al., 2013) is: 8.7%   Values used to calculate the score:     Age: 54 years     Sex: Female     Is Non-Hispanic African American: No     Diabetic: No     Tobacco smoker: No     Systolic Blood Pressure: 824 mmHg     Is BP treated: No     HDL Cholesterol: 47 mg/dL     Total Cholesterol: 170 mg/dL   2. Recurrent major depressive disorder, in full remission (Mikayla Knight) Is on lexapro and is doing well. Depression screen Mikayla Knight 2/9 06/22/2020 12/23/2019 07/25/2019  Decreased Interest 0 0 0  Down, Depressed, Hopeless 0 0 0  PHQ - 2 Score 0 0 0  Altered sleeping 0 - -  Tired, decreased energy 2 - -  Change in appetite 0 - -  Feeling bad or failure about yourself  0 - -  Trouble concentrating 0 - -  Moving slowly or fidgety/restless 0 - -  Suicidal thoughts 0 - -  PHQ-9 Score 2 - -  Difficult doing work/chores Not difficult at all - -     3. HSV-1 (herpes simplex virus 1) infection Is on valtrex daily and has had no recent flare ups  4. BMI 33.0-33.9,adult Has lost 5 lbs since last visit Wt Readings from Last 3 Encounters:  06/22/20 (!) 208 lb (94.3 kg)  12/23/19 213 lb (96.6 kg)  07/25/19 207 lb (93.9 kg)    BMI Readings from Last 3 Encounters:  06/22/20 33.57 kg/m  12/23/19 34.38 kg/m  07/25/19 33.41 kg/m        Outpatient Encounter Medications as of 06/22/2020  Medication Sig   atorvastatin (LIPITOR) 40 MG tablet Take 1 tablet (40 mg total) by mouth daily. Needs to be seen for further refills    cyclobenzaprine (FLEXERIL) 10 MG tablet Take 1 tablet (10 mg total) by mouth 3 (three) times daily as needed for muscle spasms.   doxycycline (VIBRAMYCIN) 100 MG capsule Take 1 capsule (100 mg total) by mouth 2 (two) times daily.   escitalopram (LEXAPRO) 20 MG tablet Take 1 tablet (20 mg total) by mouth daily.   valACYclovir (VALTREX) 1000 MG tablet TAKE 1 TABLET BY MOUTH THREE TIMES DAILY     Past Surgical History:  Procedure Laterality Date   COLONOSCOPY  04-01-2004    Family History  Problem Relation Age of Onset   Ovarian cancer Mother     New complaints: -Had a tick bite in April and would like to have lyme titer done. - dry patch behind left ear - still c/o fatigue and occasional dizziness  Social history: Lives by herself  Controlled substance contract: n/a    Review of Systems  Constitutional: Negative for diaphoresis.  Eyes: Negative for pain.  Respiratory: Negative for shortness of breath.   Cardiovascular: Negative for chest pain, palpitations and leg swelling.  Gastrointestinal: Negative for abdominal pain.  Endocrine:  Negative for polydipsia.  Skin: Negative for rash.  Neurological: Negative for dizziness, weakness and headaches.  Hematological: Does not bruise/bleed easily.  All other systems reviewed and are negative.      Objective:   Physical Exam Vitals and nursing note reviewed.  Constitutional:      General: She is not in acute distress.    Appearance: Normal appearance. She is well-developed.  HENT:     Head: Normocephalic.     Nose: Nose normal.  Eyes:     Pupils: Pupils are equal, round, and reactive to light.  Neck:     Vascular: No carotid bruit or JVD.  Cardiovascular:     Rate and Rhythm: Normal rate and regular rhythm.     Heart sounds: Normal heart sounds.  Pulmonary:     Effort: Pulmonary effort is normal. No respiratory distress.     Breath sounds: Normal breath sounds. No wheezing or rales.  Chest:     Chest wall: No  tenderness.  Abdominal:     General: Bowel sounds are normal. There is no distension or abdominal bruit.     Palpations: Abdomen is soft. There is no hepatomegaly, splenomegaly, mass or pulsatile mass.     Tenderness: There is no abdominal tenderness.  Musculoskeletal:        General: Normal range of motion.     Cervical back: Normal range of motion and neck supple.  Lymphadenopathy:     Cervical: No cervical adenopathy.  Skin:    General: Skin is warm and dry.     Comments: Dry flaky patch behind bil ears  Neurological:     Mental Status: She is alert and oriented to person, place, and time.     Deep Tendon Reflexes: Reflexes are normal and symmetric.  Psychiatric:        Behavior: Behavior normal.        Thought Content: Thought content normal.        Judgment: Judgment normal.    BP (!) 129/73    Pulse 75    Temp (!) 97.5 F (36.4 C) (Temporal)    Resp 20    Ht 5' 6" (1.676 m)    Wt (!) 208 lb (94.3 kg)    SpO2 97%    BMI 33.57 kg/m         Assessment & Plan:  Mikayla Knight comes in today with chief complaint of Medical Management of Chronic Issues (Recent tick bite)   Diagnosis and orders addressed:  1. Hyperlipidemia with target LDL less than 100 Low fat diet - CBC with Differential/Platelet - CMP14+EGFR - Lipid panel - atorvastatin (LIPITOR) 40 MG tablet; Take 1 tablet (40 mg total) by mouth daily. Needs to be seen for further refills  Dispense: 90 tablet; Refill: 1  2. Recurrent major depressive disorder, in full remission (Mikayla Knight) Stress management - escitalopram (LEXAPRO) 20 MG tablet; Take 1 tablet (20 mg total) by mouth daily.  Dispense: 90 tablet; Refill: 1  3. HSV-1 (herpes simplex virus 1) infection Safe sex - valACYclovir (VALTREX) 1000 MG tablet; Take 1 tablet (1,000 mg total) by mouth 3 (three) times daily.  Dispense: 90 tablet; Refill: 1  4. BMI 33.0-33.9,adult Discussed diet and exercise for person with BMI >25 Will recheck weight in 3-6  months  5. Tick bite, sequela - Lyme Ab/Western Blot Reflex  6. Chronic fatigue labe pneding - Thyroid Panel With TSH - VITAMIN D 25 Hydroxy (Vit-D Deficiency, Fractures) - Vitamin B12  7. Intrinsic eczema  Avoid scratching - Triamcinolone Acetonide (TRIAMCINOLONE 0.1 % CREAM : EUCERIN) CREA; Apply 1 application topically 2 (two) times daily.  Dispense: 1 each; Refill: 2   Labs pending Health Maintenance reviewed- will schedule mammogram on way out Diet and exercise encouraged  Follow up plan: 6 months   [name], FNP

## 2020-06-22 NOTE — Patient Instructions (Signed)
Exercising to Stay Healthy To become healthy and stay healthy, it is recommended that you do moderate-intensity and vigorous-intensity exercise. You can tell that you are exercising at a moderate intensity if your heart starts beating faster and you start breathing faster but can still hold a conversation. You can tell that you are exercising at a vigorous intensity if you are breathing much harder and faster and cannot hold a conversation while exercising. Exercising regularly is important. It has many health benefits, such as:  Improving overall fitness, flexibility, and endurance.  Increasing bone density.  Helping with weight control.  Decreasing body fat.  Increasing muscle strength.  Reducing stress and tension.  Improving overall health. How often should I exercise? Choose an activity that you enjoy, and set realistic goals. Your health care provider can help you make an activity plan that works for you. Exercise regularly as told by your health care provider. This may include:  Doing strength training two times a week, such as: ? Lifting weights. ? Using resistance bands. ? Push-ups. ? Sit-ups. ? Yoga.  Doing a certain intensity of exercise for a given amount of time. Choose from these options: ? A total of 150 minutes of moderate-intensity exercise every week. ? A total of 75 minutes of vigorous-intensity exercise every week. ? A mix of moderate-intensity and vigorous-intensity exercise every week. Children, pregnant women, people who have not exercised regularly, people who are overweight, and older adults may need to talk with a health care provider about what activities are safe to do. If you have a medical condition, be sure to talk with your health care provider before you start a new exercise program. What are some exercise ideas? Moderate-intensity exercise ideas include:  Walking 1 mile (1.6 km) in about 15  minutes.  Biking.  Hiking.  Golfing.  Dancing.  Water aerobics. Vigorous-intensity exercise ideas include:  Walking 4.5 miles (7.2 km) or more in about 1 hour.  Jogging or running 5 miles (8 km) in about 1 hour.  Biking 10 miles (16.1 km) or more in about 1 hour.  Lap swimming.  Roller-skating or in-line skating.  Cross-country skiing.  Vigorous competitive sports, such as football, basketball, and soccer.  Jumping rope.  Aerobic dancing. What are some everyday activities that can help me to get exercise?  Yard work, such as: ? Pushing a lawn mower. ? Raking and bagging leaves.  Washing your car.  Pushing a stroller.  Shoveling snow.  Gardening.  Washing windows or floors. How can I be more active in my day-to-day activities?  Use stairs instead of an elevator.  Take a walk during your lunch break.  If you drive, park your car farther away from your work or school.  If you take public transportation, get off one stop early and walk the rest of the way.  Stand up or walk around during all of your indoor phone calls.  Get up, stretch, and walk around every 30 minutes throughout the day.  Enjoy exercise with a friend. Support to continue exercising will help you keep a regular routine of activity. What guidelines can I follow while exercising?  Before you start a new exercise program, talk with your health care provider.  Do not exercise so much that you hurt yourself, feel dizzy, or get very short of breath.  Wear comfortable clothes and wear shoes with good support.  Drink plenty of water while you exercise to prevent dehydration or heat stroke.  Work out until your breathing   and your heartbeat get faster. Where to find more information  [place] Department of Health and Human Services: www.hhs.gov  Centers for Disease Control and Prevention (CDC): www.cdc.gov Summary  Exercising regularly is important. It will improve your overall fitness,  flexibility, and endurance.  Regular exercise also will improve your overall health. It can help you control your weight, reduce stress, and improve your bone density.  Do not exercise so much that you hurt yourself, feel dizzy, or get very short of breath.  Before you start a new exercise program, talk with your health care provider. This information is not intended to replace advice given to you by your health care provider. Make sure you discuss any questions you have with your health care provider. Document Revised: 10/23/2017 Document Reviewed: 10/01/2017 Elsevier Patient Education  2020 Elsevier Inc.  

## 2020-06-25 LAB — CBC WITH DIFFERENTIAL/PLATELET
Basophils Absolute: 0 10*3/uL (ref 0.0–0.2)
Basos: 1 %
EOS (ABSOLUTE): 0.2 10*3/uL (ref 0.0–0.4)
Eos: 2 %
Hematocrit: 41.3 % (ref 34.0–46.6)
Hemoglobin: 13.8 g/dL (ref 11.1–15.9)
Immature Grans (Abs): 0 10*3/uL (ref 0.0–0.1)
Immature Granulocytes: 0 %
Lymphocytes Absolute: 1.9 10*3/uL (ref 0.7–3.1)
Lymphs: 28 %
MCH: 32.7 pg (ref 26.6–33.0)
MCHC: 33.4 g/dL (ref 31.5–35.7)
MCV: 98 fL — ABNORMAL HIGH (ref 79–97)
Monocytes Absolute: 0.4 10*3/uL (ref 0.1–0.9)
Monocytes: 6 %
Neutrophils Absolute: 4.1 10*3/uL (ref 1.4–7.0)
Neutrophils: 63 %
Platelets: 231 10*3/uL (ref 150–450)
RBC: 4.22 x10E6/uL (ref 3.77–5.28)
RDW: 12.9 % (ref 11.7–15.4)
WBC: 6.6 10*3/uL (ref 3.4–10.8)

## 2020-06-25 LAB — LYME AB/WESTERN BLOT REFLEX
LYME DISEASE AB, QUANT, IGM: <0.8 index (ref 0.00–0.79)
Lyme IgG/IgM Ab: <0.91 {ISR} (ref 0.00–0.90)

## 2020-06-25 LAB — THYROID PANEL WITH TSH
Free Thyroxine Index: 2.1 (ref 1.2–4.9)
T3 Uptake Ratio: 24 % (ref 24–39)
T4, Total: 8.8 ug/dL (ref 4.5–12.0)
TSH: 2.52 u[IU]/mL (ref 0.450–4.500)

## 2020-06-25 LAB — CMP14+EGFR
ALT: 19 IU/L (ref 0–32)
AST: 17 IU/L (ref 0–40)
Albumin/Globulin Ratio: 1.6 (ref 1.2–2.2)
Albumin: 4.4 g/dL (ref 3.8–4.8)
Alkaline Phosphatase: 101 IU/L (ref 48–121)
BUN/Creatinine Ratio: 19 (ref 12–28)
BUN: 16 mg/dL (ref 8–27)
Bilirubin Total: 0.4 mg/dL (ref 0.0–1.2)
CO2: 23 mmol/L (ref 20–29)
Calcium: 9.5 mg/dL (ref 8.7–10.3)
Chloride: 102 mmol/L (ref 96–106)
Creatinine, Ser: 0.85 mg/dL (ref 0.57–1.00)
GFR calc Af Amer: 81 mL/min/{1.73_m2} (ref >59)
GFR calc non Af Amer: 71 mL/min/{1.73_m2} (ref >59)
Globulin, Total: 2.7 g/dL (ref 1.5–4.5)
Glucose: 103 mg/dL — ABNORMAL HIGH (ref 65–99)
Potassium: 4.6 mmol/L (ref 3.5–5.2)
Sodium: 140 mmol/L (ref 134–144)
Total Protein: 7.1 g/dL (ref 6.0–8.5)

## 2020-06-25 LAB — LIPID PANEL
Chol/HDL Ratio: 5.3 ratio — ABNORMAL HIGH (ref 0.0–4.4)
Cholesterol, Total: 240 mg/dL — ABNORMAL HIGH (ref 100–199)
HDL: 45 mg/dL (ref >39)
LDL Chol Calc (NIH): 161 mg/dL — ABNORMAL HIGH (ref 0–99)
Triglycerides: 187 mg/dL — ABNORMAL HIGH (ref 0–149)
VLDL Cholesterol Cal: 34 mg/dL (ref 5–40)

## 2020-06-25 LAB — VITAMIN D 25 HYDROXY (VIT D DEFICIENCY, FRACTURES): Vit D, 25-Hydroxy: 27.6 ng/mL — ABNORMAL LOW (ref 30.0–100.0)

## 2020-06-25 LAB — VITAMIN B12: Vitamin B-12: 863 pg/mL (ref 232–1245)

## 2020-08-22 ENCOUNTER — Ambulatory Visit
Admission: RE | Admit: 2020-08-22 | Discharge: 2020-08-22 | Disposition: A | Discharge status: Home / Self Care | Payer: BC Managed Care – PPO | Source: Ambulatory Visit | Attending: Nurse Practitioner | Admitting: Nurse Practitioner

## 2020-08-22 ENCOUNTER — Other Ambulatory Visit: Payer: Self-pay

## 2020-08-22 DIAGNOSIS — Z1231 Encounter for screening mammogram for malignant neoplasm of breast: Secondary | ICD-10-CM

## 2020-11-07 ENCOUNTER — Ambulatory Visit: Payer: BC Managed Care – PPO

## 2020-11-07 DIAGNOSIS — R0981 Nasal congestion: Secondary | ICD-10-CM | POA: Diagnosis not present

## 2020-11-07 DIAGNOSIS — R059 Cough, unspecified: Secondary | ICD-10-CM | POA: Diagnosis not present

## 2020-11-07 DIAGNOSIS — J0100 Acute maxillary sinusitis, unspecified: Secondary | ICD-10-CM | POA: Diagnosis not present

## 2020-11-07 DIAGNOSIS — Z6833 Body mass index (BMI) 33.0-33.9, adult: Secondary | ICD-10-CM | POA: Diagnosis not present

## 2020-12-24 ENCOUNTER — Ambulatory Visit (INDEPENDENT_AMBULATORY_CARE_PROVIDER_SITE_OTHER): Payer: BC Managed Care – PPO | Admitting: Nurse Practitioner

## 2020-12-24 ENCOUNTER — Ambulatory Visit (INDEPENDENT_AMBULATORY_CARE_PROVIDER_SITE_OTHER): Payer: BC Managed Care – PPO

## 2020-12-24 ENCOUNTER — Encounter: Payer: Self-pay | Admitting: Nurse Practitioner

## 2020-12-24 ENCOUNTER — Other Ambulatory Visit: Payer: Self-pay

## 2020-12-24 ENCOUNTER — Other Ambulatory Visit (HOSPITAL_COMMUNITY)
Admission: RE | Admit: 2020-12-24 | Discharge: 2020-12-24 | Disposition: A | Discharge status: Home / Self Care | Payer: BC Managed Care – PPO | Source: Ambulatory Visit | Attending: Nurse Practitioner | Admitting: Nurse Practitioner

## 2020-12-24 VITALS — BP 138/78 | HR 85 | Temp 97.6°F | Resp 20 | Ht 66.0 in | Wt 208.0 lb

## 2020-12-24 DIAGNOSIS — Z0001 Encounter for general adult medical examination with abnormal findings: Secondary | ICD-10-CM

## 2020-12-24 DIAGNOSIS — E785 Hyperlipidemia, unspecified: Secondary | ICD-10-CM

## 2020-12-24 DIAGNOSIS — F3342 Major depressive disorder, recurrent, in full remission: Secondary | ICD-10-CM | POA: Diagnosis not present

## 2020-12-24 DIAGNOSIS — Z6833 Body mass index (BMI) 33.0-33.9, adult: Secondary | ICD-10-CM

## 2020-12-24 DIAGNOSIS — Z0000 Encounter for general adult medical examination without abnormal findings: Secondary | ICD-10-CM

## 2020-12-24 DIAGNOSIS — B009 Herpesviral infection, unspecified: Secondary | ICD-10-CM

## 2020-12-24 DIAGNOSIS — Z23 Encounter for immunization: Secondary | ICD-10-CM

## 2020-12-24 LAB — MICROSCOPIC EXAMINATION
Bacteria, UA: NONE SEEN
RBC, Urine: NONE SEEN /hpf (ref 0–2)

## 2020-12-24 LAB — URINALYSIS, COMPLETE
Bilirubin, UA: NEGATIVE
Glucose, UA: NEGATIVE
Ketones, UA: NEGATIVE
Nitrite, UA: NEGATIVE
Protein,UA: NEGATIVE
Specific Gravity, UA: 1.025 (ref 1.005–1.030)
Urobilinogen, Ur: 0.2 mg/dL (ref 0.2–1.0)
pH, UA: 5 (ref 5.0–7.5)

## 2020-12-24 MED ORDER — ATORVASTATIN CALCIUM 40 MG PO TABS: 40.0000 mg | ORAL_TABLET | Freq: Every day | ORAL | 1 refills | Status: DC

## 2020-12-24 MED ORDER — VALACYCLOVIR HCL 1 G PO TABS: 1000.0000 mg | ORAL_TABLET | Freq: Three times a day (TID) | ORAL | 1 refills | Status: DC

## 2020-12-24 MED ORDER — ESCITALOPRAM OXALATE 20 MG PO TABS: 20.0000 mg | ORAL_TABLET | Freq: Every day | ORAL | 1 refills | Status: DC

## 2020-12-24 NOTE — Addendum Note (Signed)
Addended by: Chevis Pretty on: 12/24/2020 09:22 AM   Modules accepted: Level of Service

## 2020-12-24 NOTE — Addendum Note (Signed)
Addended by: Chevis Pretty on: 12/24/2020 08:52 AM   Modules accepted: Orders

## 2020-12-24 NOTE — Patient Instructions (Signed)

## 2020-12-24 NOTE — Addendum Note (Signed)
Addended by: Rolena Infante on: 12/24/2020 11:46 AM   Modules accepted: Orders

## 2020-12-24 NOTE — Progress Notes (Addendum)
Subjective:    Patient ID: Mikayla Knight, female    DOB: [birth year], 70 y.o.   MRN: [id number]   Chief Complaint: Annual Exam    HPI:  1. Hyperlipidemia with target LDL less than 100 Does not watch diet and does very little exercise. Just recently went back on lipitor. Lab Results  Component Value Date   CHOL 240 (H) 06/22/2020   HDL 45 06/22/2020   LDLCALC 161 (H) 06/22/2020   TRIG 187 (H) 06/22/2020   CHOLHDL 5.3 (H) 06/22/2020     2. Recurrent major depressive disorder, in full remission ([place]) Is on lexapro daily and is doing well.  Depression screen [facility] 2/9 12/24/2020 06/22/2020 12/23/2019  Decreased Interest 1 0 0  Down, Depressed, Hopeless 1 0 0  PHQ - 2 Score 2 0 0  Altered sleeping 3 0 -  Tired, decreased energy 3 2 -  Change in appetite 0 0 -  Feeling bad or failure about yourself  0 0 -  Trouble concentrating 0 0 -  Moving slowly or fidgety/restless 0 0 -  Suicidal thoughts 0 0 -  PHQ-9 Score 8 2 -  Difficult doing work/chores Not difficult at all Not difficult at all -     3. HSV-1 (herpes simplex virus 1) infection No recent outbreaks. Takes her valtrex daily  4. BMI 33.0-33.9,adult No recent weight changes Wt Readings from Last 3 Encounters:  12/24/20 208 lb (94.3 kg)  06/22/20 (!) 208 lb (94.3 kg)  12/23/19 213 lb (96.6 kg)    BMI Readings from Last 3 Encounters:  12/24/20 33.57 kg/m  06/22/20 33.57 kg/m  12/23/19 34.38 kg/m      Outpatient Encounter Medications as of 12/24/2020  Medication Sig  . atorvastatin (LIPITOR) 40 MG tablet Take 1 tablet (40 mg total) by mouth daily. Needs to be seen for further refills  . cyclobenzaprine (FLEXERIL) 10 MG tablet Take 1 tablet (10 mg total) by mouth 3 (three) times daily as needed for muscle spasms.  Marland Kitchen escitalopram (LEXAPRO) 20 MG tablet Take 1 tablet (20 mg total) by mouth daily.  . Triamcinolone Acetonide (TRIAMCINOLONE 0.1 % CREAM : EUCERIN) CREA Apply 1 application topically 2 (two) times  daily.  . valACYclovir (VALTREX) 1000 MG tablet Take 1 tablet (1,000 mg total) by mouth 3 (three) times daily.   No facility-administered encounter medications on file as of 12/24/2020.    Past Surgical History:  Procedure Laterality Date  . COLONOSCOPY  04-01-2004    Family History  Problem Relation Age of Onset  . Ovarian cancer Mother     New complaints: None today  Social history: Her granddaugher had down syndrome and died of covid this past weekend  Controlled substance contract: n/a    Review of Systems  Constitutional: Negative for diaphoresis.  Eyes: Negative for pain.  Respiratory: Negative for shortness of breath.   Cardiovascular: Negative for chest pain, palpitations and leg swelling.  Gastrointestinal: Negative for abdominal pain.  Endocrine: Negative for polydipsia.  Skin: Negative for rash.  Neurological: Negative for dizziness, weakness and headaches.  Hematological: Does not bruise/bleed easily.  All other systems reviewed and are negative.      Objective:   Physical Exam Vitals and nursing note reviewed.  Constitutional:      General: She is not in acute distress.    Appearance: Normal appearance. She is well-developed and well-nourished.  HENT:     Head: Normocephalic.     Nose: Nose normal.     Mouth/Throat:  Mouth: Oropharynx is clear and moist.  Eyes:     Extraocular Movements: EOM normal.     Pupils: Pupils are equal, round, and reactive to light.  Neck:     Vascular: No carotid bruit or JVD.  Cardiovascular:     Rate and Rhythm: Normal rate and regular rhythm.     Pulses: Intact distal pulses.     Heart sounds: Normal heart sounds.  Pulmonary:     Effort: Pulmonary effort is normal. No respiratory distress.     Breath sounds: Normal breath sounds. No wheezing or rales.  Chest:     Chest wall: No tenderness.  Abdominal:     General: Bowel sounds are normal. There is no distension or abdominal bruit. Aorta is normal.      Palpations: Abdomen is soft. There is no hepatomegaly, splenomegaly, mass or pulsatile mass.     Tenderness: There is no abdominal tenderness.  Genitourinary:    General: Normal vulva.     Vagina: No vaginal discharge.     Rectum: Normal.     Comments: Cervix parous and pink No adnexal masses or tenderness Musculoskeletal:        General: No edema. Normal range of motion.     Cervical back: Normal range of motion and neck supple.  Lymphadenopathy:     Cervical: No cervical adenopathy.  Skin:    General: Skin is warm and dry.  Neurological:     Mental Status: She is alert and oriented to person, place, and time.     Deep Tendon Reflexes: Reflexes are normal and symmetric.  Psychiatric:        Mood and Affect: Mood and affect normal.        Behavior: Behavior normal.        Thought Content: Thought content normal.        Judgment: Judgment normal.     BP 138/78   Pulse 85   Temp 97.6 F (36.4 C) (Temporal)   Resp 20   Ht '5\' 6"'  (1.676 m)   Wt 208 lb (94.3 kg)   SpO2 98%   BMI 33.57 kg/m   Adella Nissen, FNP  Chest xray- no acute or chronic findings-Preliminary reading by Ronnald Collum, FNP  [facility]      Assessment & Plan:  Mikayla Knight comes in today with chief complaint of Annual Exam   Diagnosis and orders addressed:  1. Hyperlipidemia with target LDL less than 100 Low fat diet - atorvastatin (LIPITOR) 40 MG tablet; Take 1 tablet (40 mg total) by mouth daily. Needs to be seen for further refills  Dispense: 90 tablet; Refill: 1 - CMP14+EGFR - Lipid panel  2. Recurrent major depressive disorder, in full remission ([place]) Stress management - escitalopram (LEXAPRO) 20 MG tablet; Take 1 tablet (20 mg total) by mouth daily.  Dispense: 90 tablet; Refill: 1  3. HSV-1 (herpes simplex virus 1) infection - valACYclovir (VALTREX) 1000 MG tablet; Take 1 tablet (1,000 mg total) by mouth 3 (three) times daily.  Dispense: 90 tablet; Refill: 1  4. BMI  33.0-33.9,adult Discussed diet and exercise for person with BMI >25 Will recheck weight in 3-6 months  5. Annual physical exam - Urinalysis, Complete - CBC with Differential/Platelet - Thyroid Panel With TSH - Cytology - PAP   Labs pending Health Maintenance reviewed Diet and exercise encouraged  Follow up plan: 6 months   [place], FNP

## 2020-12-25 LAB — THYROID PANEL WITH TSH
Free Thyroxine Index: 2.2 (ref 1.2–4.9)
T3 Uptake Ratio: 24 % (ref 24–39)
T4, Total: 9 ug/dL (ref 4.5–12.0)
TSH: 3.8 u[IU]/mL (ref 0.450–4.500)

## 2020-12-25 LAB — CBC WITH DIFFERENTIAL/PLATELET
Basophils Absolute: 0 10*3/uL (ref 0.0–0.2)
Basos: 1 %
EOS (ABSOLUTE): 0.1 10*3/uL (ref 0.0–0.4)
Eos: 2 %
Hematocrit: 40.6 % (ref 34.0–46.6)
Hemoglobin: 13.8 g/dL (ref 11.1–15.9)
Immature Grans (Abs): 0 10*3/uL (ref 0.0–0.1)
Immature Granulocytes: 0 %
Lymphocytes Absolute: 1.8 10*3/uL (ref 0.7–3.1)
Lymphs: 25 %
MCH: 30.9 pg (ref 26.6–33.0)
MCHC: 34 g/dL (ref 31.5–35.7)
MCV: 91 fL (ref 79–97)
Monocytes Absolute: 0.4 10*3/uL (ref 0.1–0.9)
Monocytes: 6 %
Neutrophils Absolute: 4.8 10*3/uL (ref 1.4–7.0)
Neutrophils: 66 %
Platelets: 242 10*3/uL (ref 150–450)
RBC: 4.47 x10E6/uL (ref 3.77–5.28)
RDW: 12.2 % (ref 11.7–15.4)
WBC: 7.2 10*3/uL (ref 3.4–10.8)

## 2020-12-25 LAB — CMP14+EGFR
ALT: 16 IU/L (ref 0–32)
AST: 17 IU/L (ref 0–40)
Albumin/Globulin Ratio: 1.7 (ref 1.2–2.2)
Albumin: 4.5 g/dL (ref 3.8–4.8)
Alkaline Phosphatase: 93 IU/L (ref 44–121)
BUN/Creatinine Ratio: 20 (ref 12–28)
BUN: 17 mg/dL (ref 8–27)
Bilirubin Total: 0.3 mg/dL (ref 0.0–1.2)
CO2: 24 mmol/L (ref 20–29)
Calcium: 9.4 mg/dL (ref 8.7–10.3)
Chloride: 103 mmol/L (ref 96–106)
Creatinine, Ser: 0.85 mg/dL (ref 0.57–1.00)
GFR calc Af Amer: 81 mL/min/{1.73_m2} (ref >59)
GFR calc non Af Amer: 71 mL/min/{1.73_m2} (ref >59)
Globulin, Total: 2.7 g/dL (ref 1.5–4.5)
Glucose: 97 mg/dL (ref 65–99)
Potassium: 4.5 mmol/L (ref 3.5–5.2)
Sodium: 141 mmol/L (ref 134–144)
Total Protein: 7.2 g/dL (ref 6.0–8.5)

## 2020-12-25 LAB — LIPID PANEL
Chol/HDL Ratio: 6.6 ratio — ABNORMAL HIGH (ref 0.0–4.4)
Cholesterol, Total: 277 mg/dL — ABNORMAL HIGH (ref 100–199)
HDL: 42 mg/dL (ref >39)
LDL Chol Calc (NIH): 191 mg/dL — ABNORMAL HIGH (ref 0–99)
Triglycerides: 229 mg/dL — ABNORMAL HIGH (ref 0–149)
VLDL Cholesterol Cal: 44 mg/dL — ABNORMAL HIGH (ref 5–40)

## 2020-12-26 LAB — CYTOLOGY - PAP: Diagnosis: NEGATIVE

## 2021-01-26 ENCOUNTER — Other Ambulatory Visit: Payer: Self-pay | Admitting: Nurse Practitioner

## 2021-01-26 DIAGNOSIS — M62830 Muscle spasm of back: Secondary | ICD-10-CM

## 2021-02-26 ENCOUNTER — Other Ambulatory Visit: Payer: Self-pay | Admitting: Nurse Practitioner

## 2021-02-26 DIAGNOSIS — G4730 Sleep apnea, unspecified: Secondary | ICD-10-CM

## 2021-02-26 NOTE — Progress Notes (Signed)
Sleep study

## 2021-03-31 DIAGNOSIS — Z20822 Contact with and (suspected) exposure to covid-19: Secondary | ICD-10-CM | POA: Diagnosis not present

## 2021-06-21 ENCOUNTER — Encounter: Payer: Self-pay | Admitting: Nurse Practitioner

## 2021-06-21 ENCOUNTER — Other Ambulatory Visit: Payer: Self-pay

## 2021-06-21 ENCOUNTER — Ambulatory Visit: Payer: BC Managed Care – PPO | Admitting: Nurse Practitioner

## 2021-06-21 VITALS — BP 131/85 | HR 59 | Temp 97.1°F | Resp 20 | Ht 66.0 in | Wt 210.0 lb

## 2021-06-21 DIAGNOSIS — F3342 Major depressive disorder, recurrent, in full remission: Secondary | ICD-10-CM

## 2021-06-21 DIAGNOSIS — E785 Hyperlipidemia, unspecified: Secondary | ICD-10-CM | POA: Diagnosis not present

## 2021-06-21 DIAGNOSIS — R300 Dysuria: Secondary | ICD-10-CM | POA: Diagnosis not present

## 2021-06-21 DIAGNOSIS — Z6833 Body mass index (BMI) 33.0-33.9, adult: Secondary | ICD-10-CM

## 2021-06-21 DIAGNOSIS — Z23 Encounter for immunization: Secondary | ICD-10-CM

## 2021-06-21 DIAGNOSIS — M62830 Muscle spasm of back: Secondary | ICD-10-CM | POA: Diagnosis not present

## 2021-06-21 MED ORDER — PREDNISONE 20 MG PO TABS: 40.0000 mg | ORAL_TABLET | Freq: Every day | ORAL | 0 refills | Status: AC

## 2021-06-21 MED ORDER — CYCLOBENZAPRINE HCL 10 MG PO TABS: 10.0000 mg | ORAL_TABLET | Freq: Three times a day (TID) | ORAL | 1 refills | Status: DC | PRN

## 2021-06-21 MED ORDER — ATORVASTATIN CALCIUM 40 MG PO TABS: 40.0000 mg | ORAL_TABLET | Freq: Every day | ORAL | 1 refills | Status: DC

## 2021-06-21 MED ORDER — ESCITALOPRAM OXALATE 20 MG PO TABS: 20.0000 mg | ORAL_TABLET | Freq: Every day | ORAL | 1 refills | Status: DC

## 2021-06-21 NOTE — Addendum Note (Signed)
Addended by: Rolena Infante on: 06/21/2021 12:49 PM   Modules accepted: Orders

## 2021-06-21 NOTE — Addendum Note (Signed)
Addended by: Rolena Infante on: 06/21/2021 08:55 AM   Modules accepted: Orders

## 2021-06-21 NOTE — Addendum Note (Signed)
Addended by: Chevis Pretty on: 06/21/2021 08:46 AM   Modules accepted: Orders

## 2021-06-21 NOTE — Patient Instructions (Signed)
Acute Back Pain, Adult Acute back pain is sudden and usually short-lived. It is often caused by an injury to the muscles and tissues in the back. The injury may result from: A muscle or ligament getting overstretched or torn (strained). Ligaments are tissues that connect bones to each other. Lifting something improperly can cause a back strain. Wear and tear (degeneration) of the spinal disks. Spinal disks are circular tissue that provide cushioning between the bones of the spine (vertebrae). Twisting motions, such as while playing sports or doing yard work. A hit to the back. Arthritis. You may have a physical exam, lab tests, and imaging tests to find the cause ofyour pain. Acute back pain usually goes away with rest and home care. Follow these instructions at home: Managing pain, stiffness, and swelling Treatment may include medicines for pain and inflammation that are taken by mouth or applied to the skin, prescription pain medicine, or muscle relaxants. Take over-the-counter and prescription medicines only as told by your health care provider. Your health care provider may recommend applying ice during the first 24-48 hours after your pain starts. To do this: Put ice in a plastic bag. Place a towel between your skin and the bag. Leave the ice on for 20 minutes, 2-3 times a day. If directed, apply heat to the affected area as often as told by your health care provider. Use the heat source that your health care provider recommends, such as a moist heat pack or a heating pad. Place a towel between your skin and the heat source. Leave the heat on for 20-30 minutes. Remove the heat if your skin turns bright red. This is especially important if you are unable to feel pain, heat, or cold. You have a greater risk of getting burned. Activity  Do not stay in bed. Staying in bed for more than 1-2 days can delay your recovery. Sit up and stand up straight. Avoid leaning forward when you sit or  hunching over when you stand. If you work at a desk, sit close to it so you do not need to lean over. Keep your chin tucked in. Keep your neck drawn back, and keep your elbows bent at a 90-degree angle (right angle). Sit high and close to the steering wheel when you drive. Add lower back (lumbar) support to your car seat, if needed. Take short walks on even surfaces as soon as you are able. Try to increase the length of time you walk each day. Do not sit, drive, or stand in one place for more than 30 minutes at a time. Sitting or standing for long periods of time can put stress on your back. Do not drive or use heavy machinery while taking prescription pain medicine. Use proper lifting techniques. When you bend and lift, use positions that put less stress on your back: Bend your knees. Keep the load close to your body. Avoid twisting. Exercise regularly as told by your health care provider. Exercising helps your back heal faster and helps prevent back injuries by keeping muscles strong and flexible. Work with a physical therapist to make a safe exercise program, as recommended by your health care provider. Do any exercises as told by your physical therapist.  Lifestyle Maintain a healthy weight. Extra weight puts stress on your back and makes it difficult to have good posture. Avoid activities or situations that make you feel anxious or stressed. Stress and anxiety increase muscle tension and can make back pain worse. Learn ways to manage   anxiety and stress, such as through exercise. General instructions Sleep on a firm mattress in a comfortable position. Try lying on your side with your knees slightly bent. If you lie on your back, put a pillow under your knees. Follow your treatment plan as told by your health care provider. This may include: Cognitive or behavioral therapy. Acupuncture or massage therapy. Meditation or yoga. Contact a health care provider if: You have pain that is not  relieved with rest or medicine. You have increasing pain going down into your legs or buttocks. Your pain does not improve after 2 weeks. You have pain at night. You lose weight without trying. You have a fever or chills. Get help right away if: You develop new bowel or bladder control problems. You have unusual weakness or numbness in your arms or legs. You develop nausea or vomiting. You develop abdominal pain. You feel faint. Summary Acute back pain is sudden and usually short-lived. Use proper lifting techniques. When you bend and lift, use positions that put less stress on your back. Take over-the-counter and prescription medicines and apply heat or ice as directed by your health care provider. This information is not intended to replace advice given to you by your health care provider. Make sure you discuss any questions you have with your healthcare provider. Document Revised: 07/31/2020 Document Reviewed: 08/03/2020 Elsevier Patient Education  2022 [employer] Inc.  

## 2021-06-21 NOTE — Progress Notes (Addendum)
Subjective:    Patient ID: Mikayla Knight, female    DOB: 1951/03/27, 70 y.o.   MRN: [id number]   Chief Complaint: medical management of chronic issues     HPI:  1. Hyperlipidemia with target LDL less than 100 Does try to wtahc diet. Has not been doing any dedicated exercise. She doe snot take her lipitor daily. Lab Results  Component Value Date   CHOL 277 (H) 12/24/2020   HDL 42 12/24/2020   LDLCALC 191 (H) 12/24/2020   TRIG 229 (H) 12/24/2020   CHOLHDL 6.6 (H) 12/24/2020     2. Recurrent major depressive disorder, in full remission (Mikayla Knight) Has been on lexapro for several years and  that has been working well for her. Depression screen [facility] 2/9 06/21/2021 12/24/2020 06/22/2020  Decreased Interest 0 1 0  Down, Depressed, Hopeless 0 1 0  PHQ - 2 Score 0 2 0  Altered sleeping 0 3 0  Tired, decreased energy '3 3 2  ' Change in appetite 0 0 0  Feeling bad or failure about yourself  0 0 0  Trouble concentrating 0 0 0  Moving slowly or fidgety/restless 0 0 0  Suicidal thoughts 0 0 0  PHQ-9 Score '3 8 2  ' Difficult doing work/chores Very difficult Not difficult at all Not difficult at all      3. BMI 33.0-33.9,adult Weight is up 2 lbs. Wt Readings from Last 3 Encounters:  06/21/21 210 lb (95.3 kg)  12/24/20 208 lb (94.3 kg)  06/22/20 (!) 208 lb (94.3 kg)   BMI Readings from Last 3 Encounters:  06/21/21 33.89 kg/m  12/24/20 33.57 kg/m  06/22/20 33.57 kg/m      Outpatient Encounter Medications as of 06/21/2021  Medication Sig   atorvastatin (LIPITOR) 40 MG tablet Take 1 tablet (40 mg total) by mouth daily. Needs to be seen for further refills   cyclobenzaprine (FLEXERIL) 10 MG tablet Take 1 tablet by mouth three times daily as needed for muscle spasm   escitalopram (LEXAPRO) 20 MG tablet Take 1 tablet (20 mg total) by mouth daily.   Triamcinolone Acetonide (TRIAMCINOLONE 0.1 % CREAM : EUCERIN) CREA Apply 1 application topically 2 (two) times daily.   valACYclovir  (VALTREX) 1000 MG tablet Take 1 tablet (1,000 mg total) by mouth 3 (three) times daily.   No facility-administered encounter medications on file as of 06/21/2021.    Past Surgical History:  Procedure Laterality Date   COLONOSCOPY  04-01-2004    Family History  Problem Relation Age of Onset   Ovarian cancer Mother     New complaints: Still having back spasms several times a week.  Social history: Lives by herself.  Controlled substance contract: n/a     Review of Systems  Constitutional:  Negative for diaphoresis.  Eyes:  Negative for pain.  Respiratory:  Negative for shortness of breath.   Cardiovascular:  Negative for chest pain, palpitations and leg swelling.  Gastrointestinal:  Negative for abdominal pain.  Endocrine: Negative for polydipsia.  Musculoskeletal:  Positive for arthralgias (knee pain).  Skin:  Negative for rash.  Neurological:  Negative for dizziness, weakness and headaches.  Hematological:  Does not bruise/bleed easily.  All other systems reviewed and are negative.     Objective:   Physical Exam Vitals and nursing note reviewed.  Constitutional:      General: She is not in acute distress.    Appearance: Normal appearance. She is well-developed.  HENT:     Head: Normocephalic.  Right Ear: Tympanic membrane normal.     Left Ear: Tympanic membrane normal.     Nose: Nose normal.     Mouth/Throat:     Mouth: Mucous membranes are moist.  Eyes:     Pupils: Pupils are equal, round, and reactive to light.  Neck:     Vascular: No carotid bruit or JVD.  Cardiovascular:     Rate and Rhythm: Normal rate and regular rhythm.     Heart sounds: Normal heart sounds.  Pulmonary:     Effort: Pulmonary effort is normal. No respiratory distress.     Breath sounds: Normal breath sounds. No wheezing or rales.  Chest:     Chest wall: No tenderness.  Abdominal:     General: Bowel sounds are normal. There is no distension or abdominal bruit.     Palpations:  Abdomen is soft. There is no hepatomegaly, splenomegaly, mass or pulsatile mass.     Tenderness: There is no abdominal tenderness.  Musculoskeletal:        General: Normal range of motion.     Cervical back: Normal range of motion and neck supple.  Lymphadenopathy:     Cervical: No cervical adenopathy.  Skin:    General: Skin is warm and dry.  Neurological:     Mental Status: She is alert and oriented to person, place, and time.     Deep Tendon Reflexes: Reflexes are normal and symmetric.  Psychiatric:        Behavior: Behavior normal.        Thought Content: Thought content normal.        Judgment: Judgment normal.   BP 131/85   Pulse (!) 59   Temp (!) 97.1 F (36.2 C) (Temporal)   Resp 20   Ht '5\' 6"'  (1.676 m)   Wt 210 lb (95.3 kg)   SpO2 93%   BMI 33.89 kg/m         Assessment & Plan:   Mikayla Knight comes in today with chief complaint of Medical Management of Chronic Issues   Diagnosis and orders addressed:  1. Hyperlipidemia with target LDL less than 100 Low fat diet - CBC with Differential/Platelet - CMP14+EGFR - Lipid panel - atorvastatin (LIPITOR) 40 MG tablet; Take 1 tablet (40 mg total) by mouth daily. Needs to be seen for further refills  Dispense: 90 tablet; Refill: 1  2. Recurrent major depressive disorder, in full remission (Mikayla Knight) Stress management - escitalopram (LEXAPRO) 20 MG tablet; Take 1 tablet (20 mg total) by mouth daily.  Dispense: 90 tablet; Refill: 1  3. BMI 33.0-33.9,adult Discussed diet and exercise for person with BMI >25 Will recheck weight in 3-6 months   4. Back spasm Moist heat Stretches - prednisone 20 mg 2 po daily for 5 days - cyclobenzaprine (FLEXERIL) 10 MG tablet; Take 1 tablet (10 mg total) by mouth 3 (three) times daily as needed. for muscle spams  Dispense: 30 tablet; Refill: 1   Labs pending Health Maintenance reviewed Diet and exercise encouraged  Follow up plan: 6 months   Mikayla Hassell Done, Mikayla Knight

## 2021-06-22 LAB — CBC WITH DIFFERENTIAL/PLATELET
Basophils Absolute: 0.1 10*3/uL (ref 0.0–0.2)
Basos: 1 %
EOS (ABSOLUTE): 0.2 10*3/uL (ref 0.0–0.4)
Eos: 3 %
Hematocrit: 42.2 % (ref 34.0–46.6)
Hemoglobin: 14.1 g/dL (ref 11.1–15.9)
Immature Grans (Abs): 0 10*3/uL (ref 0.0–0.1)
Immature Granulocytes: 0 %
Lymphocytes Absolute: 2 10*3/uL (ref 0.7–3.1)
Lymphs: 29 %
MCH: 31.3 pg (ref 26.6–33.0)
MCHC: 33.4 g/dL (ref 31.5–35.7)
MCV: 94 fL (ref 79–97)
Monocytes Absolute: 0.5 10*3/uL (ref 0.1–0.9)
Monocytes: 7 %
Neutrophils Absolute: 4.1 10*3/uL (ref 1.4–7.0)
Neutrophils: 60 %
Platelets: 266 10*3/uL (ref 150–450)
RBC: 4.5 x10E6/uL (ref 3.77–5.28)
RDW: 12.3 % (ref 11.7–15.4)
WBC: 6.8 10*3/uL (ref 3.4–10.8)

## 2021-06-22 LAB — CMP14+EGFR
ALT: 19 IU/L (ref 0–32)
AST: 17 IU/L (ref 0–40)
Albumin/Globulin Ratio: 1.8 (ref 1.2–2.2)
Albumin: 4.5 g/dL (ref 3.8–4.8)
Alkaline Phosphatase: 94 IU/L (ref 44–121)
BUN/Creatinine Ratio: 25 (ref 12–28)
BUN: 22 mg/dL (ref 8–27)
Bilirubin Total: 0.4 mg/dL (ref 0.0–1.2)
CO2: 23 mmol/L (ref 20–29)
Calcium: 9.5 mg/dL (ref 8.7–10.3)
Chloride: 102 mmol/L (ref 96–106)
Creatinine, Ser: 0.87 mg/dL (ref 0.57–1.00)
Globulin, Total: 2.5 g/dL (ref 1.5–4.5)
Glucose: 101 mg/dL — ABNORMAL HIGH (ref 65–99)
Potassium: 4.5 mmol/L (ref 3.5–5.2)
Sodium: 140 mmol/L (ref 134–144)
Total Protein: 7 g/dL (ref 6.0–8.5)
eGFR: 72 mL/min/{1.73_m2} (ref >59)

## 2021-06-22 LAB — LIPID PANEL
Chol/HDL Ratio: 6.3 ratio — ABNORMAL HIGH (ref 0.0–4.4)
Cholesterol, Total: 315 mg/dL — ABNORMAL HIGH (ref 100–199)
HDL: 50 mg/dL (ref >39)
LDL Chol Calc (NIH): 235 mg/dL — ABNORMAL HIGH (ref 0–99)
Triglycerides: 159 mg/dL — ABNORMAL HIGH (ref 0–149)
VLDL Cholesterol Cal: 30 mg/dL (ref 5–40)

## 2021-06-24 LAB — URINALYSIS, COMPLETE
Bilirubin, UA: NEGATIVE
Glucose, UA: NEGATIVE
Ketones, UA: NEGATIVE
Nitrite, UA: NEGATIVE
Protein,UA: NEGATIVE
RBC, UA: NEGATIVE
Specific Gravity, UA: 1.03 (ref 1.005–1.030)
Urobilinogen, Ur: 0.2 mg/dL (ref 0.2–1.0)
pH, UA: 5.5 (ref 5.0–7.5)

## 2021-06-24 LAB — MICROSCOPIC EXAMINATION

## 2021-07-10 ENCOUNTER — Telehealth: Payer: Self-pay | Admitting: Nurse Practitioner

## 2021-07-11 ENCOUNTER — Other Ambulatory Visit: Payer: Self-pay | Admitting: Nurse Practitioner

## 2021-07-11 DIAGNOSIS — G4730 Sleep apnea, unspecified: Secondary | ICD-10-CM

## 2021-07-11 NOTE — Telephone Encounter (Signed)
Sleep study ordered . They will contact patient about scheduling

## 2021-07-11 NOTE — Progress Notes (Signed)
Order sent in they will contact you about scheduling

## 2021-07-11 NOTE — Telephone Encounter (Signed)
I ordered sleep study back in April. I do not know why it was not scheduled.

## 2021-08-29 ENCOUNTER — Other Ambulatory Visit: Payer: Self-pay

## 2021-08-29 ENCOUNTER — Encounter: Payer: Self-pay | Admitting: Nurse Practitioner

## 2021-08-29 ENCOUNTER — Ambulatory Visit: Payer: BC Managed Care – PPO | Admitting: Nurse Practitioner

## 2021-08-29 VITALS — BP 119/68 | HR 73 | Temp 98.4°F | Resp 20 | Ht 66.0 in | Wt 213.0 lb

## 2021-08-29 DIAGNOSIS — M25561 Pain in right knee: Secondary | ICD-10-CM | POA: Diagnosis not present

## 2021-08-29 MED ORDER — PREDNISONE 10 MG (21) PO TBPK: ORAL_TABLET | ORAL | 0 refills | Status: DC

## 2021-08-29 NOTE — Patient Instructions (Signed)
Acute Knee Pain, Adult Acute knee pain is sudden and may be caused by damage, swelling, or irritation of the muscles and tissues that support the knee. Pain may result from: A fall. An injury to the knee from twisting motions. A hit to the knee. Infection. Acute knee pain may go away on its own with time and rest. If it does not, your health care provider may order tests to find the cause of the pain. These may include: Imaging tests, such as an X-ray, MRI, CT scan, or ultrasound. Joint aspiration. In this test, fluid is removed from the knee and evaluated. Arthroscopy. In this test, a lighted tube is inserted into the knee and an image is projected onto a TV screen. Biopsy. In this test, a sample of tissue is removed from the body and studied under a microscope. Follow these instructions at home: If you have a knee sleeve or brace:  Wear the knee sleeve or brace as told by your health care provider. Remove it only as told by your health care provider. Loosen it if your toes tingle, become numb, or turn cold and blue. Keep it clean. If the knee sleeve or brace is not waterproof: Do not let it get wet. Cover it with a watertight covering when you take a bath or shower.  Activity Rest your knee. Do not do things that cause pain or make pain worse. Avoid high-impact activities or exercises, such as running, jumping rope, or doing jumping jacks. Work with a physical therapist to make a safe exercise program, as recommended by your health care provider. Do exercises as told by your physical therapist. Managing pain, stiffness, and swelling  If directed, put ice on the affected knee. To do this: If you have a removable knee sleeve or brace, remove it as told by your health care provider. Put ice in a plastic bag. Place a towel between your skin and the bag. Leave the ice on for 20 minutes, 2-3 times a day. Remove the ice if your skin turns bright red. This is very important. If you cannot  feel pain, heat, or cold, you have a greater risk of damage to the area. If directed, use an elastic bandage to put pressure (compression) on your injured knee. This may control swelling, give support, and help with discomfort. Raise (elevate) your knee above the level of your heart while you are sitting or lying down. Sleep with a pillow under your knee.  General instructions Take over-the-counter and prescription medicines only as told by your health care provider. Do not use any products that contain nicotine or tobacco, such as cigarettes, e-cigarettes, and chewing tobacco. If you need help quitting, ask your health care provider. If you are overweight, work with your health care provider and a dietitian to set a weight-loss goal that is healthy and reasonable for you. Extra weight can put pressure on your knee. Pay attention to any changes in your symptoms. Keep all follow-up visits. This is important. Contact a health care provider if: Your knee pain continues, changes, or gets worse. You have a fever along with knee pain. Your knee feels warm to the touch or is red. Your knee buckles or locks up. Get help right away if: Your knee swells, and the swelling becomes worse. You cannot move your knee. You have severe pain in your knee that cannot be managed with pain medicine. Summary Acute knee pain can be caused by a fall, an injury, an infection, or damage, swelling,   or irritation of the tissues that support your knee. Your health care provider may perform tests to find out the cause of the pain. Pay attention to any changes in your symptoms. Relieve your pain with rest, medicines, light activity, and the use of ice. Get help right away if your knee swells, you cannot move your knee, or you have severe pain that cannot be managed with medicine. This information is not intended to replace advice given to you by your health care provider. Make sure you discuss any questions you have with  your healthcare provider. Document Revised: 04/25/2020 Document Reviewed: 04/25/2020 Elsevier Patient Education  2022 Elsevier Inc.  

## 2021-08-29 NOTE — Progress Notes (Signed)
   Subjective:    Patient ID: Mikayla Knight, female    DOB: 29-Dec-1950, 70 y.o.   MRN: [id number]   Chief Complaint: Knee Pain (Right)   HPI  Patient comes in today c/o right knee pain. Started 2-3 weeks ago. Denies injury. Pain is intermittent. Rates pain 5/10 currently. Walking and standing increase pain. Messaging it helps.    Review of Systems  Constitutional:  Negative for diaphoresis.  Eyes:  Negative for pain.  Respiratory:  Negative for shortness of breath.   Cardiovascular:  Negative for chest pain, palpitations and leg swelling.  Gastrointestinal:  Negative for abdominal pain.  Endocrine: Negative for polydipsia.  Skin:  Negative for rash.  Neurological:  Negative for dizziness, weakness and headaches.  Hematological:  Does not bruise/bleed easily.  All other systems reviewed and are negative.     Objective:   Physical Exam Vitals and nursing note reviewed.  Constitutional:      Appearance: Normal appearance.  Cardiovascular:     Rate and Rhythm: Normal rate and regular rhythm.     Heart sounds: Normal heart sounds.  Pulmonary:     Effort: Pulmonary effort is normal.     Breath sounds: Normal breath sounds.  Musculoskeletal:     Comments: Mild medial right knee effusion Pain on full flexion and extension No patella tenderness All ligaments intact.  Skin:    General: Skin is warm.  Neurological:     General: No focal deficit present.     Mental Status: She is alert and oriented to person, place, and time.    BP 119/68   Pulse 73   Temp 98.4 F (36.9 C) (Temporal)   Resp 20   Ht 5\' 6"  (1.676 m)   Wt 213 lb (96.6 kg)   SpO2 97%   BMI 34.38 kg/m        Assessment & Plan:  Mikayla Knight in today with chief complaint of Knee Pain (Right)   1. Acute pain of right knee Rest Ice bid Elevate when sitting Compression wrap If no better in 1-2 weeks will do ortho referral RTO prn   The above assessment and management plan was discussed with  the patient. The patient verbalized understanding of and has agreed to the management plan. Patient is aware to call the clinic if symptoms persist or worsen. Patient is aware when to return to the clinic for a follow-up visit. Patient educated on when it is appropriate to go to the emergency department.   [name], FNP

## 2021-09-09 IMAGING — DX DG CHEST 2V
2 series · 2 of 2 positions shown · non-contrast
Comparison: None.

CLINICAL DATA: Screening

EXAM:
CHEST - 2 VIEW

[chest pa]
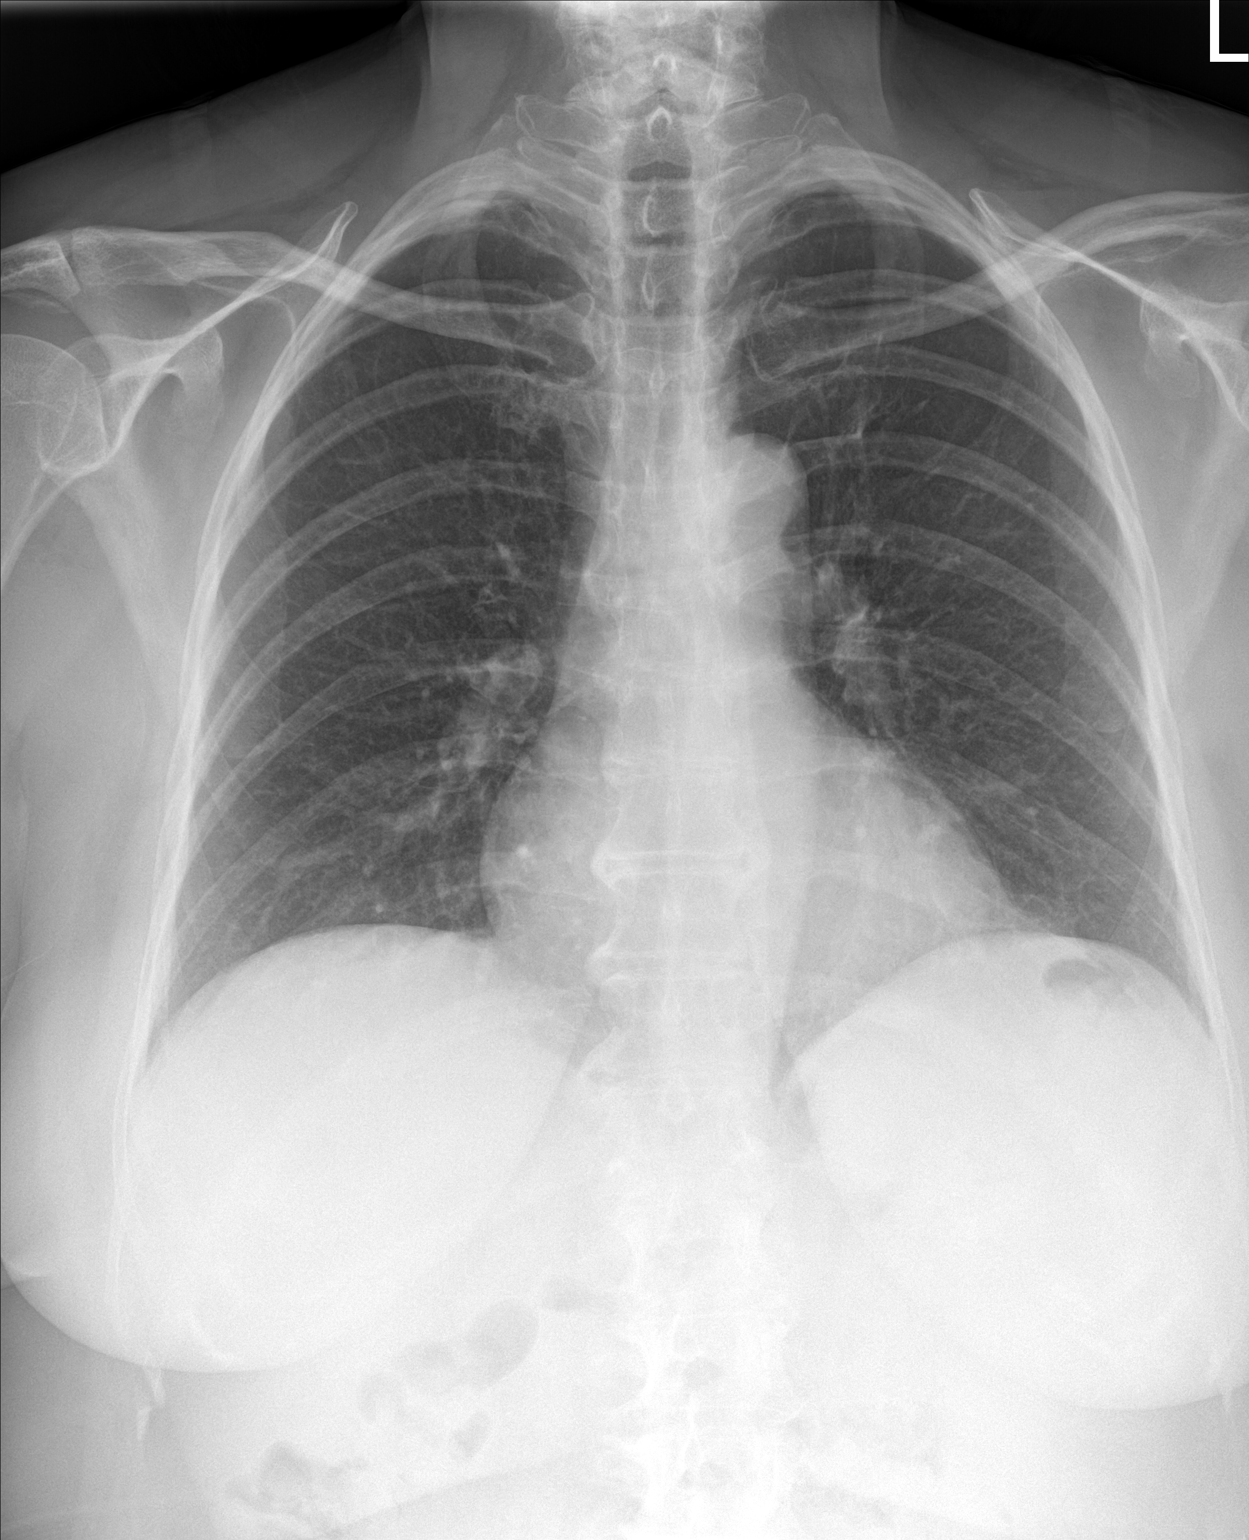

[chest lat]
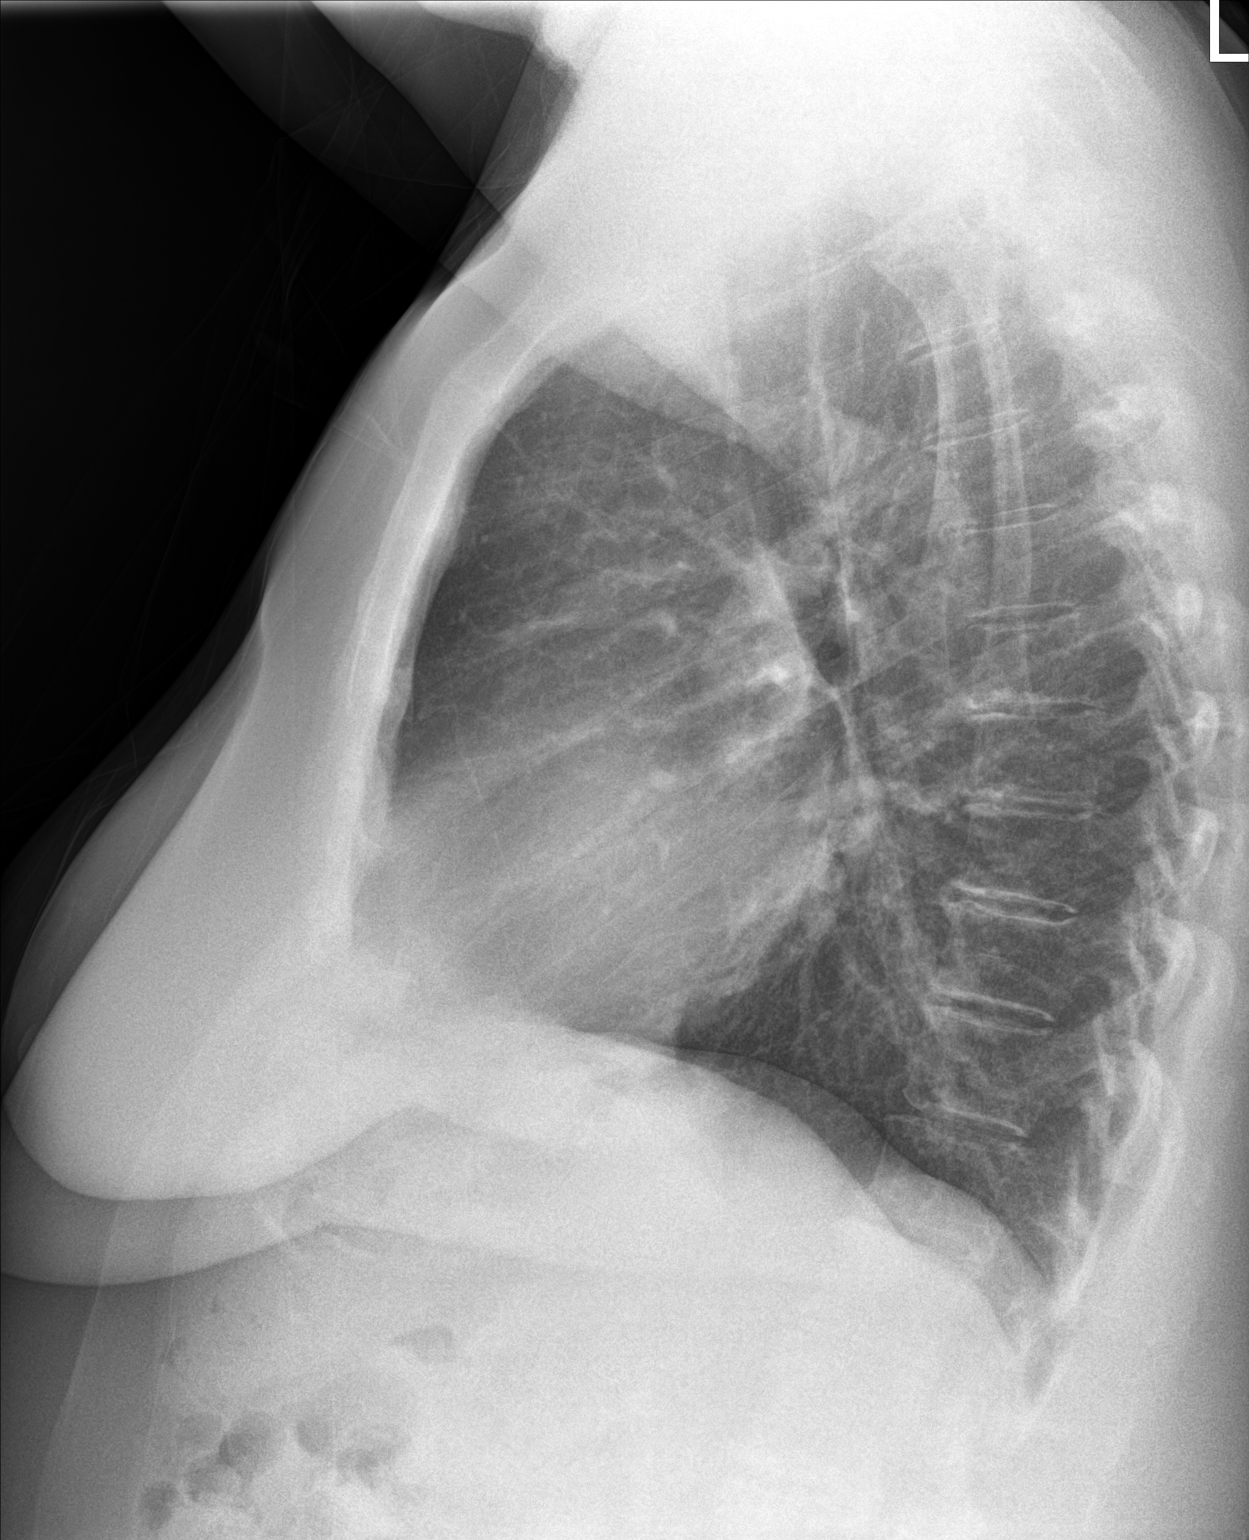

[2 of 2 positions shown; findings below may reference images not displayed]

FINDINGS: The heart size and mediastinal contours are within normal limits. No
focal consolidation. No pleural effusion. No pneumothorax. Thoracic
spondylosis.
IMPRESSION: No active cardiopulmonary disease.

## 2021-11-29 ENCOUNTER — Ambulatory Visit: Payer: BC Managed Care – PPO | Admitting: Nurse Practitioner

## 2021-11-29 ENCOUNTER — Encounter: Payer: Self-pay | Admitting: Nurse Practitioner

## 2021-11-29 VITALS — BP 131/74 | HR 75 | Temp 98.0°F | Resp 20 | Ht 66.0 in | Wt 212.0 lb

## 2021-11-29 DIAGNOSIS — I498 Other specified cardiac arrhythmias: Secondary | ICD-10-CM | POA: Diagnosis not present

## 2021-11-29 DIAGNOSIS — R0609 Other forms of dyspnea: Secondary | ICD-10-CM

## 2021-11-29 NOTE — Progress Notes (Signed)
° °  Subjective:    Patient ID: Mikayla Knight, female    DOB: 1951/04/09, 71 y.o.   MRN: [id number]   Chief Complaint: Shortness of Breath (At times/), heart fluttering, and Slurring words at times   HPI Patient comes in today c/o sob at times. Usually occurs when she is walking of climbing steps. Sheis also feeling like her heart is fluttering at times. Can occur when she is active or at rest. Says it feels like heart skips a beat. Denies tachycardia.she has had acouple of episodes that she felt like she could not get her words out.     Review of Systems  Constitutional:  Negative for diaphoresis.  Eyes:  Negative for pain.  Respiratory:  Negative for shortness of breath.   Cardiovascular:  Negative for chest pain, palpitations and leg swelling.  Gastrointestinal:  Negative for abdominal pain.  Endocrine: Negative for polydipsia.  Skin:  Negative for rash.  Neurological:  Negative for dizziness, weakness and headaches.  Hematological:  Does not bruise/bleed easily.  All other systems reviewed and are negative.     Objective:   Physical Exam Vitals reviewed.  Constitutional:      Appearance: Normal appearance. She is well-developed. She is obese.  Cardiovascular:     Rate and Rhythm: Normal rate and regular rhythm.     Heart sounds: Normal heart sounds.  Pulmonary:     Effort: Pulmonary effort is normal.     Breath sounds: Normal breath sounds.  Skin:    General: Skin is warm.  Neurological:     General: No focal deficit present.     Mental Status: She is alert and oriented to person, place, and time.     Cranial Nerves: No cranial nerve deficit.     Sensory: No sensory deficit.  Psychiatric:        Mood and Affect: Mood normal.        Behavior: Behavior normal.        Thought Content: Thought content normal.   BP 131/74    Pulse 75    Temp 98 F (36.7 C) (Temporal)    Resp 20    Ht 5\' 6"  (1.676 m)    Wt 212 lb (96.2 kg)    SpO2 98%    BMI 34.22 kg/m          Assessment & Plan:   [name] in today with chief complaint of Shortness of Breath (At times/), heart fluttering, and Slurring words at times   1. Fluttering heart - EKG 12-Lead  2. Dyspnea on exertion Avoid caffeine Keep dairy of events No strenuous activity - Ambulatory referral to Cardiology    The above assessment and management plan was discussed with the patient. The patient verbalized understanding of and has agreed to the management plan. Patient is aware to call the clinic if symptoms persist or worsen. Patient is aware when to return to the clinic for a follow-up visit. Patient educated on when it is appropriate to go to the emergency department.   [name], FNP

## 2021-11-29 NOTE — Patient Instructions (Signed)
Shortness of Breath, Adult °Shortness of breath means you have trouble breathing. Shortness of breath could be a sign of a medical problem. °Follow these instructions at home: °Pollution °Do not smoke or use any products that contain nicotine or tobacco. If you need help quitting, ask your doctor. °Avoid things that can make it harder to breathe, such as: °Smoke of all kinds. This includes smoke from campfires or forest fires. Do not smoke or allow others to smoke in your home. °Mold. °Dust. °Air pollution. °Chemical smells. °Things that can give you an allergic reaction (allergens) if you have allergies. °Keep your living space clean. Use products that help remove mold and dust. °General instructions °Watch for any changes in your symptoms. °Take over-the-counter and prescription medicines only as told by your doctor. This includes oxygen therapy and inhaled medicines. °Rest as needed. °Return to your normal activities when your doctor says that it is safe. °Keep all follow-up visits. °Contact a doctor if: °Your condition does not get better as soon as expected. °You have a hard time doing your normal activities, even after you rest. °You have new symptoms. °You cannot walk up stairs. °You cannot exercise the way you normally do. °Get help right away if: °Your shortness of breath gets worse. °You have trouble breathing when you are resting. °You feel light-headed or you faint. °You have a cough that is not helped by medicines. °You cough up blood. °You have pain with breathing. °You have pain in your chest, arms, shoulders, or belly (abdomen). °You have a fever. °These symptoms may be an emergency. Get help right away. Call 911. °Do not wait to see if the symptoms will go away. °Do not drive yourself to the hospital. °Summary °Shortness of breath is when you have trouble breathing enough air. It can be a sign of a medical problem. °Avoid things that make it hard for you to breathe, such as smoking, pollution, mold,  and dust. °Watch for any changes in your symptoms. Contact your doctor if you do not get better or you get worse. °This information is not intended to replace advice given to you by your health care provider. Make sure you discuss any questions you have with your health care provider. °Document Revised: 06/29/2021 Document Reviewed: 06/29/2021 °Elsevier Patient Education © 2022 Elsevier Inc. ° °

## 2021-12-17 NOTE — Progress Notes (Signed)
Referring-[name], FNP Reason for referral-dyspnea and palpitations  HPI: 71 year old female for evaluation of dyspnea and palpitations at request of [place], FNP.  Patient states that she has had some dyspnea on exertion in the past.  However over the past 2 months her symptoms are worse with less activity.  There is no orthopnea, PND, pedal edema.  She has not had chest pain.  She also describes occasional palpitations over the past month described as a flutter.  There is no associated syncope.  They are short-lived but she has these daily.  Cardiology now asked to evaluate.  Current Outpatient Medications  Medication Sig Dispense Refill   atorvastatin (LIPITOR) 40 MG tablet Take 1 tablet (40 mg total) by mouth daily. Needs to be seen for further refills 90 tablet 1   escitalopram (LEXAPRO) 20 MG tablet Take 1 tablet (20 mg total) by mouth daily. 90 tablet 1   Triamcinolone Acetonide (TRIAMCINOLONE 0.1 % CREAM : EUCERIN) CREA Apply 1 application topically 2 (two) times daily. 1 each 2   valACYclovir (VALTREX) 1000 MG tablet Take 1 tablet (1,000 mg total) by mouth 3 (three) times daily. 90 tablet 1   cyclobenzaprine (FLEXERIL) 10 MG tablet Take 1 tablet (10 mg total) by mouth 3 (three) times daily as needed. for muscle spams (Patient not taking: Reported on 12/27/2021) 30 tablet 1   No current facility-administered medications for this visit.    No Known Allergies   Past Medical History:  Diagnosis Date   Depression    Hyperlipidemia    Internal hemorrhoids     Past Surgical History:  Procedure Laterality Date   COLONOSCOPY  04-01-2004    Social History   Socioeconomic History   Marital status: Divorced    Spouse name: Not on file   Number of children: 3   Years of education: Not on file   Highest education level: Not on file  Occupational History    Employer: [employer]  Tobacco Use   Smoking status: Never   Smokeless tobacco:  Never  Substance and Sexual Activity   Alcohol use: Yes    Comment: Occasional   Drug use: No   Sexual activity: Not on file  Other Topics Concern   Not on file  Social History Narrative   Not on file   Social Determinants of Health   Financial Resource Strain: Not on file  Food Insecurity: Not on file  Transportation Needs: Not on file  Physical Activity: Not on file  Stress: Not on file  Social Connections: Not on file  Intimate Partner Violence: Not on file    Family History  Problem Relation Age of Onset   Ovarian cancer Mother     ROS: no fevers or chills, productive cough, hemoptysis, dysphasia, odynophagia, melena, hematochezia, dysuria, hematuria, rash, seizure activity, orthopnea, PND, pedal edema, claudication. Remaining systems are negative.  Physical Exam:   Blood pressure (!) 150/74, pulse 98, height 5\' 6"  (1.676 m), weight 211 lb 6.4 oz (95.9 kg), SpO2 97 %.  General:  Well developed/well nourished in NAD Skin warm/dry Patient not depressed No peripheral clubbing Back-normal HEENT-normal/normal eyelids Neck supple/normal carotid upstroke bilaterally; no bruits; no JVD; no thyromegaly chest - CTA/ normal expansion CV - RRR/normal S1 and S2; no murmurs, rubs or gallops;  PMI nondisplaced Abdomen -NT/ND, no HSM, no mass, + bowel sounds, no bruit 2+ femoral pulses, no bruits Ext-no edema, chords, 2+ DP Neuro-grossly nonfocal  ECG -November 29, 2021-normal sinus rhythm  with no ST changes.  Personally reviewed  A/P  1 dyspnea-etiology unclear.  I will arrange an echocardiogram to assess LV function.  Note she is not having chest pain and her electrocardiogram is normal.  There may be a contribution from obesity/deconditioning.  She also states she snores.  We will arrange a sleep study to rule out obstructive sleep apnea.  2 palpitations-we will arrange a 3-day monitor to further assess.  3 hyperlipidemia-continue statin.  Kirk Ruths, MD

## 2021-12-24 ENCOUNTER — Ambulatory Visit: Payer: BC Managed Care – PPO | Admitting: Nurse Practitioner

## 2021-12-24 ENCOUNTER — Encounter: Payer: Self-pay | Admitting: Nurse Practitioner

## 2021-12-24 VITALS — BP 122/69 | HR 85 | Temp 97.3°F | Resp 20 | Ht 66.0 in | Wt 210.0 lb

## 2021-12-24 DIAGNOSIS — E785 Hyperlipidemia, unspecified: Secondary | ICD-10-CM

## 2021-12-24 DIAGNOSIS — Z6833 Body mass index (BMI) 33.0-33.9, adult: Secondary | ICD-10-CM | POA: Diagnosis not present

## 2021-12-24 DIAGNOSIS — F3342 Major depressive disorder, recurrent, in full remission: Secondary | ICD-10-CM | POA: Diagnosis not present

## 2021-12-24 DIAGNOSIS — B009 Herpesviral infection, unspecified: Secondary | ICD-10-CM

## 2021-12-24 LAB — LIPID PANEL

## 2021-12-24 MED ORDER — ATORVASTATIN CALCIUM 40 MG PO TABS: 40.0000 mg | ORAL_TABLET | Freq: Every day | ORAL | 1 refills | Status: DC

## 2021-12-24 MED ORDER — ESCITALOPRAM OXALATE 20 MG PO TABS: 20.0000 mg | ORAL_TABLET | Freq: Every day | ORAL | 1 refills | Status: DC

## 2021-12-24 MED ORDER — VALACYCLOVIR HCL 1 G PO TABS: 1000.0000 mg | ORAL_TABLET | Freq: Three times a day (TID) | ORAL | 1 refills | Status: DC

## 2021-12-24 NOTE — Progress Notes (Signed)
° °Subjective:  ° ° Patient ID: Mikayla Knight, female    DOB: 02/23/1951, 70 y.o.   MRN: [id number] ° ° °Chief Complaint: medical management of chronic issues  °  ° °HPI: ° °Mikayla Knight is a 70 y.o. who identifies as a female who was assigned female at birth.  ° °Social history: °Lives with: by herself- family checks on her daily °Work history: [employer] ° ° °Comes in today for follow up of the following chronic medical issues: ° °1. Hyperlipidemia with target LDL less than 100 °Does not watch diet and does no dedicated exercise. She is back on her lipitor °Lab Results  °Component Value Date  ° CHOL 315 (H) 06/21/2021  ° HDL 50 06/21/2021  ° LDLCALC 235 (H) 06/21/2021  ° TRIG 159 (H) 06/21/2021  ° CHOLHDL 6.3 (H) 06/21/2021  °The 10-year ASCVD risk score (Arnett DK, et al., 2019) is: 9.3% ° ° ° °2. Recurrent major depressive disorder, in full remission (HCC) °Is on lexapro and is doing well. °Depression screen PHQ 2/9 12/24/2021 11/29/2021 06/21/2021  °Decreased Interest 0 0 0  °Down, Depressed, Hopeless 0 0 0  °PHQ - 2 Score 0 0 0  °Altered sleeping 0 0 0  °Tired, decreased energy 0 0 3  °Change in appetite 0 0 0  °Feeling bad or failure about yourself  0 0 0  °Trouble concentrating 0 0 0  °Moving slowly or fidgety/restless 0 0 0  °Suicidal thoughts 0 0 0  °PHQ-9 Score 0 0 3  °Difficult doing work/chores Not difficult at all Not difficult at all Very difficult  °Some recent data might be hidden  ° ° ° °3. BMI 33.0-33.9,adult °No recent weight changes °Wt Readings from Last 3 Encounters:  °11/29/21 212 lb (96.2 kg)  °08/29/21 213 lb (96.6 kg)  °06/21/21 210 lb (95.3 kg)  ° °BMI Readings from Last 3 Encounters:  °11/29/21 34.22 kg/m²  °08/29/21 34.38 kg/m²  °06/21/21 33.89 kg/m²  ° ° ° ° °New complaints: °None  today ° °No Known Allergies °Outpatient Encounter Medications as of 12/24/2021  °Medication Sig  ° atorvastatin (LIPITOR) 40 MG tablet Take 1 tablet (40 mg total) by mouth daily. Needs to be  seen for further refills  ° cyclobenzaprine (FLEXERIL) 10 MG tablet Take 1 tablet (10 mg total) by mouth 3 (three) times daily as needed. for muscle spams (Patient not taking: Reported on 11/29/2021)  ° escitalopram (LEXAPRO) 20 MG tablet Take 1 tablet (20 mg total) by mouth daily.  ° Triamcinolone Acetonide (TRIAMCINOLONE 0.1 % CREAM : EUCERIN) CREA Apply 1 application topically 2 (two) times daily.  ° valACYclovir (VALTREX) 1000 MG tablet Take 1 tablet (1,000 mg total) by mouth 3 (three) times daily.  ° °No facility-administered encounter medications on file as of 12/24/2021.  ° ° °Past Surgical History:  °Procedure Laterality Date  ° COLONOSCOPY  04-01-2004  ° ° °Family History  °Problem Relation Age of Onset  ° Ovarian cancer Mother   ° ° ° ° °Controlled substance contract: n/a ° ° ° ° °Review of Systems  °Constitutional:  Negative for diaphoresis.  °Eyes:  Negative for pain.  °Respiratory:  Negative for shortness of breath.   °Cardiovascular:  Negative for chest pain, palpitations and leg swelling.  °Gastrointestinal:  Negative for abdominal pain.  °Endocrine: Negative for polydipsia.  °Skin:  Negative for rash.  °Neurological:  Negative for dizziness, weakness and headaches.  °Hematological:  Does not bruise/bleed easily.  °All other systems reviewed and are   negative. ° °   °Objective:  ° Physical Exam °Vitals and nursing note reviewed.  °Constitutional:   °   General: She is not in acute distress. °   Appearance: Normal appearance. She is well-developed.  °HENT:  °   Head: Normocephalic.  °   Right Ear: Tympanic membrane normal.  °   Left Ear: Tympanic membrane normal.  °   Nose: Nose normal.  °   Mouth/Throat:  °   Mouth: Mucous membranes are moist.  °Eyes:  °   Pupils: Pupils are equal, round, and reactive to light.  °Neck:  °   Vascular: No carotid bruit or JVD.  °Cardiovascular:  °   Rate and Rhythm: Normal rate and regular rhythm.  °   Heart sounds: Normal heart sounds.  °Pulmonary:  °   Effort: Pulmonary  effort is normal. No respiratory distress.  °   Breath sounds: Normal breath sounds. No wheezing or rales.  °Chest:  °   Chest wall: No tenderness.  °Abdominal:  °   General: Bowel sounds are normal. There is no distension or abdominal bruit.  °   Palpations: Abdomen is soft. There is no hepatomegaly, splenomegaly, mass or pulsatile mass.  °   Tenderness: There is no abdominal tenderness.  °Musculoskeletal:     °   General: Normal range of motion.  °   Cervical back: Normal range of motion and neck supple.  °Lymphadenopathy:  °   Cervical: No cervical adenopathy.  °Skin: °   General: Skin is warm and dry.  °Neurological:  °   Mental Status: She is alert and oriented to person, place, and time.  °   Deep Tendon Reflexes: Reflexes are normal and symmetric.  °Psychiatric:     °   Behavior: Behavior normal.     °   Thought Content: Thought content normal.     °   Judgment: Judgment normal.  ° °BP 122/69    Pulse 85    Temp (!) 97.3 °F (36.3 °C) (Temporal)    Resp 20    Ht 5' 6" (1.676 m)    Wt 210 lb (95.3 kg)    SpO2 96%    BMI 33.89 kg/m²  ° ° ° ° °   °Assessment & Plan:  ° °Mikayla Knight comes in today with chief complaint of Medical Management of Chronic Issues ° ° °Diagnosis and orders addressed: ° °1. Hyperlipidemia with target LDL less than 100 °Low fat diet °- atorvastatin (LIPITOR) 40 MG tablet; Take 1 tablet (40 mg total) by mouth daily. Needs to be seen for further refills  Dispense: 90 tablet; Refill: 1 °- CBC with Differential/Platelet °- CMP14+EGFR °- Lipid panel ° °2. Recurrent major depressive disorder, in full remission (HCC) °Stress manaegment °- escitalopram (LEXAPRO) 20 MG tablet; Take 1 tablet (20 mg total) by mouth daily.  Dispense: 90 tablet; Refill: 1 ° °3. BMI 33.0-33.9,adult °Discussed diet and exercise for person with BMI >25 °Will recheck weight in 3-6 months ° ° °4. HSV-1 (herpes simplex virus 1) infection °- valACYclovir (VALTREX) 1000 MG tablet; Take 1 tablet (1,000 mg total) by mouth 3  (three) times daily.  Dispense: 90 tablet; Refill: 1 ° ° °Labs pending °Health Maintenance reviewed °Diet and exercise encouraged ° °Follow up plan: °6 months ° ° °Mary-Margaret Martin, FNP ° °

## 2021-12-24 NOTE — Patient Instructions (Signed)

## 2021-12-25 LAB — CMP14+EGFR
ALT: 19 IU/L (ref 0–32)
AST: 19 IU/L (ref 0–40)
Albumin/Globulin Ratio: 2 (ref 1.2–2.2)
Albumin: 4.7 g/dL (ref 3.8–4.8)
Alkaline Phosphatase: 118 IU/L (ref 44–121)
BUN/Creatinine Ratio: 16 (ref 12–28)
BUN: 14 mg/dL (ref 8–27)
Bilirubin Total: 0.4 mg/dL (ref 0.0–1.2)
CO2: 24 mmol/L (ref 20–29)
Calcium: 9.3 mg/dL (ref 8.7–10.3)
Chloride: 103 mmol/L (ref 96–106)
Creatinine, Ser: 0.87 mg/dL (ref 0.57–1.00)
Globulin, Total: 2.3 g/dL (ref 1.5–4.5)
Glucose: 74 mg/dL (ref 70–99)
Potassium: 4.9 mmol/L (ref 3.5–5.2)
Sodium: 141 mmol/L (ref 134–144)
Total Protein: 7 g/dL (ref 6.0–8.5)
eGFR: 72 mL/min/{1.73_m2} (ref >59)

## 2021-12-25 LAB — LIPID PANEL
Chol/HDL Ratio: 4.3 ratio (ref 0.0–4.4)
Cholesterol, Total: 193 mg/dL (ref 100–199)
HDL: 45 mg/dL (ref >39)
LDL Chol Calc (NIH): 122 mg/dL — ABNORMAL HIGH (ref 0–99)
Triglycerides: 147 mg/dL (ref 0–149)
VLDL Cholesterol Cal: 26 mg/dL (ref 5–40)

## 2021-12-25 LAB — CBC WITH DIFFERENTIAL/PLATELET
Basophils Absolute: 0 10*3/uL (ref 0.0–0.2)
Basos: 1 %
EOS (ABSOLUTE): 0.3 10*3/uL (ref 0.0–0.4)
Eos: 4 %
Hematocrit: 41.4 % (ref 34.0–46.6)
Hemoglobin: 14 g/dL (ref 11.1–15.9)
Immature Grans (Abs): 0 10*3/uL (ref 0.0–0.1)
Immature Granulocytes: 0 %
Lymphocytes Absolute: 1.8 10*3/uL (ref 0.7–3.1)
Lymphs: 25 %
MCH: 32.3 pg (ref 26.6–33.0)
MCHC: 33.8 g/dL (ref 31.5–35.7)
MCV: 96 fL (ref 79–97)
Monocytes Absolute: 0.4 10*3/uL (ref 0.1–0.9)
Monocytes: 6 %
Neutrophils Absolute: 4.6 10*3/uL (ref 1.4–7.0)
Neutrophils: 64 %
Platelets: 237 10*3/uL (ref 150–450)
RBC: 4.33 x10E6/uL (ref 3.77–5.28)
RDW: 12.2 % (ref 11.7–15.4)
WBC: 7.2 10*3/uL (ref 3.4–10.8)

## 2021-12-27 ENCOUNTER — Other Ambulatory Visit: Payer: Self-pay

## 2021-12-27 ENCOUNTER — Ambulatory Visit: Payer: Managed Care, Other (non HMO)

## 2021-12-27 ENCOUNTER — Ambulatory Visit (INDEPENDENT_AMBULATORY_CARE_PROVIDER_SITE_OTHER): Payer: BC Managed Care – PPO | Admitting: Cardiology

## 2021-12-27 ENCOUNTER — Encounter: Payer: Self-pay | Admitting: Cardiology

## 2021-12-27 VITALS — BP 150/74 | HR 98 | Ht 66.0 in | Wt 211.4 lb

## 2021-12-27 DIAGNOSIS — R0602 Shortness of breath: Secondary | ICD-10-CM | POA: Diagnosis not present

## 2021-12-27 DIAGNOSIS — R0683 Snoring: Secondary | ICD-10-CM

## 2021-12-27 DIAGNOSIS — R002 Palpitations: Secondary | ICD-10-CM | POA: Diagnosis not present

## 2021-12-27 NOTE — Patient Instructions (Signed)
Testing/Procedures:  Your physician has requested that you have an echocardiogram. Echocardiography is a painless test that uses sound waves to create images of your heart. It provides your doctor with information about the size and shape of your heart and how well your hearts chambers and valves are working. This procedure takes approximately one hour. There are no restrictions for this procedure. [place] Instructions  Your physician has requested you wear a ZIO patch monitor for 3 days.  This is a single patch monitor. [employer] supplies one patch monitor per enrollment. Additional stickers are not available. Please do not apply patch if you will be having a Nuclear Stress Test,  Echocardiogram, Cardiac CT, MRI, or Chest Xray during the period you would be wearing the  monitor. The patch cannot be worn during these tests. You cannot remove and re-apply the  ZIO XT patch monitor.  Your ZIO patch monitor will be mailed 3 day USPS to your address on file. It may take 3-5 days  to receive your monitor after you have been enrolled.  Once you have received your monitor, please review the enclosed instructions. Your monitor  has already been registered assigning a specific monitor serial # to you.  Billing and Patient Assistance Program Information  We have supplied [employer] with any of your insurance information on file for billing purposes. [employer] offers a sliding scale Patient Assistance Program for patients that do not have  insurance, or whose insurance does not completely cover the cost of the ZIO monitor.  You must apply for the Patient Assistance Program to qualify for this discounted rate.  To apply, please call [employer] at [phone], select option 4, select option 2, ask to apply for  Patient Assistance Program. Theodore Demark will ask your household income, and how many people  are in your household. They will quote your out-of-pocket cost  based on that information.  [employer] will also be able to set up a 75-month, interest-free payment plan if needed.  Applying the monitor   Shave hair from upper left chest.  Hold abrader disc by orange tab. Rub abrader in 40 strokes over the upper left chest as  indicated in your monitor instructions.  Clean area with 4 enclosed alcohol pads. Let dry.  Apply patch as indicated in monitor instructions. Patch will be placed under collarbone on left  side of chest with arrow pointing upward.  Rub patch adhesive wings for 2 minutes. Remove white label marked "1". Remove the white  label marked "2". Rub patch adhesive wings for 2 additional minutes.  While looking in a mirror, press and release button in center of patch. A small green light will  flash 3-4 times. This will be your only indicator that the monitor has been turned on.  Do not shower for the first 24 hours. You may shower after the first 24 hours.  Press the button if you feel a symptom. You will hear a small click. Record Date, Time and  Symptom in the Patient Logbook.  When you are ready to remove the patch, follow instructions on the last 2 pages of Patient  Logbook. Stick patch monitor onto the last page of Patient Logbook.  Place Patient Logbook in the blue and white box. Use locking tab on box and tape box closed  securely. The blue and white box has prepaid postage on it. Please place it in the mailbox as  soon as possible. Your physician should have  your test results approximately 7 days after the  monitor has been mailed back to [facility].  Call [facility] at [phone] if you have questions regarding  your ZIO XT patch monitor. Call them immediately if you see an orange light blinking on your  monitor.  If your monitor falls off in less than 4 days, contact our Monitor department at [phone].  If your monitor becomes loose or falls off after 4 days call [employer] at [phone] for   suggestions on securing your monitor    Your physician has recommended that you have a sleep study. This test records several body functions during sleep, including: brain activity, eye movement, oxygen and carbon dioxide blood levels, heart rate and rhythm, breathing rate and rhythm, the flow of air through your mouth and nose, snoring, body muscle movements, and chest and belly movement.    Follow-Up: At [facility], you and your health needs are our priority.  As part of our continuing mission to provide you with exceptional heart care, we have created designated Provider Care Teams.  These Care Teams include your primary Cardiologist (physician) and Advanced Practice Providers (APPs -  Physician Assistants and Nurse Practitioners) who all work together to provide you with the care you need, when you need it.  We recommend signing up for the patient portal called "MyChart".  Sign up information is provided on this After Visit Summary.  MyChart is used to connect with patients for Virtual Visits (Telemedicine).  Patients are able to view lab/test results, encounter notes, upcoming appointments, etc.  Non-urgent messages can be sent to your provider as well.   To learn more about what you can do with MyChart, go to [email or web address].    Your next appointment:   3 month(s)  The format for your next appointment:   In Person  Provider:    Kirk Ruths MD

## 2021-12-27 NOTE — Progress Notes (Unsigned)
Enrolled patient for a 3 day Zio XT monitor to be mailed to patients home  

## 2022-01-07 ENCOUNTER — Other Ambulatory Visit: Payer: Self-pay

## 2022-01-07 ENCOUNTER — Ambulatory Visit (HOSPITAL_COMMUNITY): Payer: BC Managed Care – PPO | Attending: Cardiology

## 2022-01-07 DIAGNOSIS — R0602 Shortness of breath: Secondary | ICD-10-CM

## 2022-01-07 LAB — ECHOCARDIOGRAM COMPLETE
Area-P 1/2: 3.81 cm2
S' Lateral: 3 cm

## 2022-01-09 ENCOUNTER — Encounter: Payer: Self-pay | Admitting: *Deleted

## 2022-01-16 ENCOUNTER — Telehealth: Payer: Self-pay | Admitting: *Deleted

## 2022-01-16 NOTE — Telephone Encounter (Signed)
Split night sleep study appointment details left on voicemail.

## 2022-01-23 ENCOUNTER — Telehealth: Payer: Self-pay | Admitting: Nurse Practitioner

## 2022-01-23 NOTE — Telephone Encounter (Signed)
I have tried calling patient on her cell phone and work phone to let her know that unfortunately her appt to see the triage nurse on Monday for a BP check had to be cancelled because there are no standing orders for her to have her BP checked. Because she has no diagnosis of having high/low BP, she would need to schedule an appt to see a provider if she feels like her BP is running high or low. If she prefers not to see a provider, then she can always check her BP at her local pharmacy or even purchase a BP monitor to keep with her. ? ?This message is per our lead nurse, Abigail Butts.  ? ?Patient did not answer either phone when I tried calling and was not able to leave a message.  ?

## 2022-01-27 ENCOUNTER — Ambulatory Visit: Payer: BC Managed Care – PPO

## 2022-01-27 ENCOUNTER — Ambulatory Visit (INDEPENDENT_AMBULATORY_CARE_PROVIDER_SITE_OTHER): Payer: BC Managed Care – PPO | Admitting: *Deleted

## 2022-01-27 DIAGNOSIS — Z0130 Encounter for examination of blood pressure without abnormal findings: Secondary | ICD-10-CM | POA: Insufficient documentation

## 2022-02-21 ENCOUNTER — Other Ambulatory Visit: Payer: Self-pay | Admitting: Nurse Practitioner

## 2022-02-21 DIAGNOSIS — G4730 Sleep apnea, unspecified: Secondary | ICD-10-CM

## 2022-02-21 NOTE — Progress Notes (Signed)
Ef sleep study ?

## 2022-03-10 ENCOUNTER — Ambulatory Visit: Payer: BC Managed Care – PPO | Attending: Cardiology | Admitting: Cardiovascular Disease

## 2022-03-10 DIAGNOSIS — G4733 Obstructive sleep apnea (adult) (pediatric): Secondary | ICD-10-CM | POA: Diagnosis not present

## 2022-03-10 DIAGNOSIS — R0683 Snoring: Secondary | ICD-10-CM | POA: Diagnosis not present

## 2022-03-14 ENCOUNTER — Encounter: Payer: Self-pay | Admitting: Cardiovascular Disease

## 2022-03-14 NOTE — Procedures (Signed)
? ? ?                            ? ? ?                                  Lind Asotin     ? ? ? ?Patient Name: Mikayla Knight, Mikayla Knight ?Study Date: 03/10/2022 ?Gender: Female ?D.O.B: 20-Jan-1951 ?Age (years): 15 ?Referring Provider: Kirk Ruths ?Height (inches): 66 ?Interpreting Physician: Shelva Majestic MD, ABSM ?Weight (lbs): 211 ?RPSGT: Rosebud Poles ?BMI: 34 ?MRN: [id number] ?Neck Size: 16.00 ? ?CLINICAL INFORMATION ?Sleep Study Type: NPSG ? ?Indication for sleep study: Snoring,  ? ?Epworth Sleepiness Score: 4 ? ?SLEEP STUDY TECHNIQUE ?As per the AASM Manual for the Scoring of Sleep and Associated Events v2.3 (April 2016) with a hypopnea requiring 4% desaturations. ? ?The channels recorded and monitored were frontal, central and occipital EEG, electrooculogram (EOG), submentalis EMG (chin), nasal and oral airflow, thoracic and abdominal wall motion, anterior tibialis EMG, snore microphone, electrocardiogram, and pulse oximetry. ? ?MEDICATIONS ?atorvastatin (LIPITOR) 40 MG tablet ?cyclobenzaprine (FLEXERIL) 10 MG tablet ?escitalopram (LEXAPRO) 20 MG tablet ?Triamcinolone Acetonide (TRIAMCINOLONE 0.1 % CREAM : EUCERIN) CREA ?valACYclovir  ?Medications self-administered by patient taken the night of the study : N/A ? ?SLEEP ARCHITECTURE ?The study was initiated at 10:00:11 PM and ended at 4:34:30 AM. ? ?Sleep onset time was 17.3 minutes and the sleep efficiency was 90.4%. The total sleep time was 356.5 minutes. ? ?Stage REM latency was 130.5 minutes. ? ?The patient spent 3.79% of the night in stage N1 sleep, 53.58% in stage N2 sleep, 19.49% in stage N3 and 23.1% in REM. ? ?Alpha intrusion was absent. ? ?Supine sleep was 100.00%. ? ?RESPIRATORY PARAMETERS ?The overall apnea/hypopnea index (AHI) was 17.8 per hour. The respiratory disturbance index (RDI) was 23.6/h. There were 3 total apneas, including 3 obstructive, 0 central and 0 mixed apneas. There were 103 hypopneas and 34 RERAs. ? ?The AHI during Stage REM sleep was  53.8 per hour. ? ?AHI while supine was 17.8 per hour. ? ?The mean oxygen saturation was 93.33%. The minimum SpO2 during sleep was 83.00%. ? ?Loud snoring was noted during this study. ? ?CARDIAC DATA ?The 2 lead EKG demonstrated sinus rhythm. The mean heart rate was 75.88 beats per minute. Other EKG findings include: None. ? ?LEG MOVEMENT DATA ?The total PLMS were 0 with a resulting PLMS index of 0.00. Associated arousal with leg movement index was 0.0. ? ?IMPRESSIONS ?- Moderate obstructive sleep apnea occurred during this study (AHI 17.8/h; RDI 23.6/h); however, sleep apnea was severe during REM sleep (AHI 53.8/h). ?- Moderate oxygen desaturation to a nadir of 83%. ?- The patient snored with loud snoring volume. ?- No cardiac abnormalities were noted during this study. ?- Clinically significant periodic limb movements did not occur during sleep. No significant associated arousals. ? ?DIAGNOSIS ?- Obstructive Sleep Apnea (G47.33) ?- Loud Snoring ? ?RECOMMENDATIONS ?- Therapeutic CPAP titration to determine optimal pressure required to alleviate sleep disordered breathing. If unable to obtain an in-lab study, initiate Auto - PAP with EPR of 3 at 7 - 18 cm of water. ?- Effort should be made to optimize nasal and oropharyngeal patency.  ?- Positional therapy avoiding supine position during sleep. ?- Avoid alcohol, sedatives and other CNS depressants that may worsen sleep apnea and disrupt normal sleep architecture. ?- Sleep hygiene should be  reviewed to assess factors that may improve sleep quality. ?- Weight management (BMI 34) and regular exercise should be initiated or continued if appropriate. ?- Recommend a download and sleep clinic evaluation after one month of therapy. ? ?[Electronically signed] 03/14/2022 10:09 AM ? ?Shelva Majestic MD, [facility], ABSM ?Diplomate, Tax adviser of Sleep Medicine ? ?NPI: [id number] ? ?[place] ?PH: [phone]   FX: [phone] ?ACCREDITED BY THE  AMERICAN ACADEMY OF SLEEP MEDICINE ? ?

## 2022-03-20 ENCOUNTER — Telehealth: Payer: Self-pay | Admitting: *Deleted

## 2022-03-20 NOTE — Telephone Encounter (Signed)
Attempted to contact the patient to discuss sleep study results and recommendations. Mail box is full and  cannot leave a message. My chart message will be sent. ?

## 2022-03-20 NOTE — Telephone Encounter (Signed)
-----   Message from Troy Sine, MD sent at 03/14/2022 10:17 AM EDT ----- ?Mariann Laster, please notify pt of results and set up for CPAP titration or Auto-PAP ?

## 2022-03-24 NOTE — Telephone Encounter (Signed)
Attempted to contact the patient on work #. Was informed patient not working today. Called cell # and was unable to leave a message due to full mailbox. ?

## 2022-03-25 ENCOUNTER — Other Ambulatory Visit: Payer: Self-pay | Admitting: Cardiovascular Disease

## 2022-03-25 ENCOUNTER — Telehealth: Payer: Self-pay | Admitting: *Deleted

## 2022-03-25 DIAGNOSIS — G4733 Obstructive sleep apnea (adult) (pediatric): Secondary | ICD-10-CM

## 2022-03-25 NOTE — Telephone Encounter (Signed)
Patient called in and was given sleep study results and recommendations. She agrees to proceed. ?

## 2022-03-25 NOTE — Telephone Encounter (Signed)
-----   Message from Troy Sine, MD sent at 03/14/2022 10:17 AM EDT ----- ?Mikayla Knight, please notify pt of results and set up for CPAP titration or Auto-PAP ?

## 2022-04-02 ENCOUNTER — Telehealth: Payer: Self-pay | Admitting: *Deleted

## 2022-04-02 NOTE — Telephone Encounter (Signed)
Per [employer]) no PA is required for CPAP titration. Call reference # [id number]. ?

## 2022-04-17 ENCOUNTER — Ambulatory Visit: Payer: Managed Care, Other (non HMO) | Admitting: Cardiology

## 2022-05-23 ENCOUNTER — Encounter: Payer: BC Managed Care – PPO | Admitting: Cardiovascular Disease

## 2022-06-18 NOTE — Patient Instructions (Signed)
Our records indicate that you are due for your annual mammogram/breast imaging. While there is no way to prevent breast cancer, early detection provides the best opportunity for curing it. For women over the age of 40, the American Cancer Society recommends a yearly clinical breast exam and a yearly mammogram. These practices have saved thousands of lives. We need your help to ensure that you are receiving optimal medical care. Please call the imaging location that has done you previous mammograms. Please remember to list us as your primary care. This helps make sure we receive a report and can update your chart.  Below is the contact information for several local breast imaging centers. You may call the location that works best for you, and they will be happy to assistance in making you an appointment. You do not need an order for a regular screening mammogram. However, if you are having any problems or concerns with you breast area, please let your primary care provider know, and appropriate orders will be placed. Please let our office know if you have any questions or concerns. Or if you need information for another imaging center not on this list or outside of the area. We are commented to working with you on your health care journey.   The mobile unit/bus ([facility]) - they come twice a month to our location.  These appointments can be made through our office or by call The Breast Center  [facility]  [address] [place], [place] [address] Phone [phone]  [facility] Department  [address]  [place], [place] [address] [phone]  [facility])  [address] [place], [place] [address] [phone]  [facility] [place]  [address] [place] [address] [phone]  [facility]  [address], [address] [place] [address] [phone]  [facility] [place]  [address] [place], [place] [address] [phone]  [facility] [address] [place], [place] [address] [phone]  [facility] [address]  [address] [place], [place] [address] [phone]  [facility] [address] [place] [address] [phone]     

## 2022-06-23 ENCOUNTER — Ambulatory Visit: Payer: BC Managed Care – PPO | Admitting: Nurse Practitioner

## 2022-06-23 ENCOUNTER — Encounter: Payer: Self-pay | Admitting: Nurse Practitioner

## 2022-06-23 VITALS — BP 124/70 | HR 80 | Temp 97.7°F | Resp 20 | Ht 66.0 in | Wt 211.0 lb

## 2022-06-23 DIAGNOSIS — Z6833 Body mass index (BMI) 33.0-33.9, adult: Secondary | ICD-10-CM | POA: Diagnosis not present

## 2022-06-23 DIAGNOSIS — F3342 Major depressive disorder, recurrent, in full remission: Secondary | ICD-10-CM | POA: Diagnosis not present

## 2022-06-23 DIAGNOSIS — E785 Hyperlipidemia, unspecified: Secondary | ICD-10-CM

## 2022-06-23 DIAGNOSIS — G4733 Obstructive sleep apnea (adult) (pediatric): Secondary | ICD-10-CM

## 2022-06-23 LAB — LIPID PANEL
Chol/HDL Ratio: 4.1 ratio (ref 0.0–4.4)
Cholesterol, Total: 171 mg/dL (ref 100–199)
HDL: 42 mg/dL (ref >39)
LDL Chol Calc (NIH): 105 mg/dL — ABNORMAL HIGH (ref 0–99)
Triglycerides: 134 mg/dL (ref 0–149)
VLDL Cholesterol Cal: 24 mg/dL (ref 5–40)

## 2022-06-23 LAB — CMP14+EGFR
ALT: 22 IU/L (ref 0–32)
AST: 21 IU/L (ref 0–40)
Albumin/Globulin Ratio: 1.8 (ref 1.2–2.2)
Albumin: 4.2 g/dL (ref 3.9–4.9)
Alkaline Phosphatase: 103 IU/L (ref 44–121)
BUN/Creatinine Ratio: 21 (ref 12–28)
BUN: 16 mg/dL (ref 8–27)
Bilirubin Total: 0.5 mg/dL (ref 0.0–1.2)
CO2: 24 mmol/L (ref 20–29)
Calcium: 9.5 mg/dL (ref 8.7–10.3)
Chloride: 102 mmol/L (ref 96–106)
Creatinine, Ser: 0.78 mg/dL (ref 0.57–1.00)
Globulin, Total: 2.4 g/dL (ref 1.5–4.5)
Glucose: 101 mg/dL — ABNORMAL HIGH (ref 70–99)
Potassium: 5 mmol/L (ref 3.5–5.2)
Sodium: 140 mmol/L (ref 134–144)
Total Protein: 6.6 g/dL (ref 6.0–8.5)
eGFR: 82 mL/min/{1.73_m2} (ref >59)

## 2022-06-23 LAB — CBC WITH DIFFERENTIAL/PLATELET
Basophils Absolute: 0 10*3/uL (ref 0.0–0.2)
Basos: 1 %
EOS (ABSOLUTE): 0.2 10*3/uL (ref 0.0–0.4)
Eos: 4 %
Hematocrit: 37.5 % (ref 34.0–46.6)
Hemoglobin: 12.7 g/dL (ref 11.1–15.9)
Immature Grans (Abs): 0 10*3/uL (ref 0.0–0.1)
Immature Granulocytes: 0 %
Lymphocytes Absolute: 1.9 10*3/uL (ref 0.7–3.1)
Lymphs: 29 %
MCH: 32.2 pg (ref 26.6–33.0)
MCHC: 33.9 g/dL (ref 31.5–35.7)
MCV: 95 fL (ref 79–97)
Monocytes Absolute: 0.3 10*3/uL (ref 0.1–0.9)
Monocytes: 5 %
Neutrophils Absolute: 3.9 10*3/uL (ref 1.4–7.0)
Neutrophils: 61 %
Platelets: 228 10*3/uL (ref 150–450)
RBC: 3.95 x10E6/uL (ref 3.77–5.28)
RDW: 11.9 % (ref 11.7–15.4)
WBC: 6.4 10*3/uL (ref 3.4–10.8)

## 2022-06-23 MED ORDER — ATORVASTATIN CALCIUM 40 MG PO TABS: 40.0000 mg | ORAL_TABLET | Freq: Every day | ORAL | 1 refills | Status: DC

## 2022-06-23 MED ORDER — ESCITALOPRAM OXALATE 20 MG PO TABS: 20.0000 mg | ORAL_TABLET | Freq: Every day | ORAL | 1 refills | Status: DC

## 2022-06-23 NOTE — Progress Notes (Signed)
Subjective:    Patient ID: Mikayla Knight, female    DOB: [birth year], 71 y.o.   MRN: [id number]   Chief Complaint: No chief complaint on file.    HPI:  Mikayla Knight is a 71 y.o. who identifies as a female who was assigned female at birth.   Social history: Lives with: by herself Work history: [place] in today for follow up of the following chronic medical issues:  1. Hyperlipidemia with target LDL less than 100 Does try to watch diet but does not exercise very often Lab Results  Component Value Date   CHOL 193 12/24/2021   HDL 45 12/24/2021   LDLCALC 122 (H) 12/24/2021   TRIG 147 12/24/2021   CHOLHDL 4.3 12/24/2021   The 10-year ASCVD risk score (Arnett DK, et al., 2019) is: 9.2%   2. Recurrent major depressive disorder, in full remission ([place]) Patient is on lexapro and is doing well.     06/23/2022    8:52 AM 12/24/2021    8:19 AM 11/29/2021    8:37 AM  Depression screen PHQ 2/9  Decreased Interest 0 0 0  Down, Depressed, Hopeless 0 0 0  PHQ - 2 Score 0 0 0  Altered sleeping 0 0 0  Tired, decreased energy 3 0 0  Change in appetite 0 0 0  Feeling bad or failure about yourself  0 0 0  Trouble concentrating 0 0 0  Moving slowly or fidgety/restless 0 0 0  Suicidal thoughts 0 0 0  PHQ-9 Score 3 0 0  Difficult doing work/chores Somewhat difficult Not difficult at all Not difficult at all     3. BMI 33.0-33.9,adult No recent weight changes Wt Readings from Last 3 Encounters:  06/23/22 211 lb (95.7 kg)  12/27/21 211 lb 6.4 oz (95.9 kg)  12/24/21 210 lb (95.3 kg)   BMI Readings from Last 3 Encounters:  06/23/22 34.06 kg/m  12/27/21 34.12 kg/m  12/24/21 33.89 kg/m     New complaints: Had sleep study which showed slep apnea. She is going to be titrated with cpap next week.  No Known Allergies Outpatient Encounter Medications as of 06/23/2022  Medication Sig   atorvastatin (LIPITOR) 40 MG tablet Take 1 tablet (40 mg total)  by mouth daily. Needs to be seen for further refills   cyclobenzaprine (FLEXERIL) 10 MG tablet Take 1 tablet (10 mg total) by mouth 3 (three) times daily as needed. for muscle spams (Patient not taking: Reported on 12/27/2021)   escitalopram (LEXAPRO) 20 MG tablet Take 1 tablet (20 mg total) by mouth daily.   Triamcinolone Acetonide (TRIAMCINOLONE 0.1 % CREAM : EUCERIN) CREA Apply 1 application topically 2 (two) times daily.   valACYclovir (VALTREX) 1000 MG tablet Take 1 tablet (1,000 mg total) by mouth 3 (three) times daily.   No facility-administered encounter medications on file as of 06/23/2022.    Past Surgical History:  Procedure Laterality Date   COLONOSCOPY  04-01-2004    Family History  Problem Relation Age of Onset   Ovarian cancer Mother       Controlled substance contract: n/a     Review of Systems  Constitutional:  Negative for diaphoresis.  Eyes:  Negative for pain.  Respiratory:  Negative for shortness of breath.   Cardiovascular:  Negative for chest pain, palpitations and leg swelling.  Gastrointestinal:  Negative for abdominal pain.  Endocrine: Negative for polydipsia.  Skin:  Negative for rash.  Neurological:  Negative for dizziness,  weakness and headaches.  Hematological:  Does not bruise/bleed easily.  All other systems reviewed and are negative.      Objective:   Physical Exam Vitals and nursing note reviewed.  Constitutional:      General: She is not in acute distress.    Appearance: Normal appearance. She is well-developed.  HENT:     Head: Normocephalic.     Right Ear: Tympanic membrane normal.     Left Ear: Tympanic membrane normal.     Nose: Nose normal.     Mouth/Throat:     Mouth: Mucous membranes are moist.  Eyes:     Pupils: Pupils are equal, round, and reactive to light.  Neck:     Vascular: No carotid bruit or JVD.  Cardiovascular:     Rate and Rhythm: Normal rate and regular rhythm.     Heart sounds: Normal heart sounds.   Pulmonary:     Effort: Pulmonary effort is normal. No respiratory distress.     Breath sounds: Normal breath sounds. No wheezing or rales.  Chest:     Chest wall: No tenderness.  Abdominal:     General: Bowel sounds are normal. There is no distension or abdominal bruit.     Palpations: Abdomen is soft. There is no hepatomegaly, splenomegaly, mass or pulsatile mass.     Tenderness: There is no abdominal tenderness.  Musculoskeletal:        General: Normal range of motion.     Cervical back: Normal range of motion and neck supple.  Lymphadenopathy:     Cervical: No cervical adenopathy.  Skin:    General: Skin is warm and dry.  Neurological:     Mental Status: She is alert and oriented to person, place, and time.     Deep Tendon Reflexes: Reflexes are normal and symmetric.  Psychiatric:        Behavior: Behavior normal.        Thought Content: Thought content normal.        Judgment: Judgment normal.    BP 124/70   Pulse 80   Temp 97.7 F (36.5 C) (Temporal)   Resp 20   Ht '5\' 6"'$  (1.676 m)   Wt 211 lb (95.7 kg)   SpO2 97%   BMI 34.06 kg/m         Assessment & Plan:   Mikayla Knight comes in today with chief complaint of Medical Management of Chronic Issues   Diagnosis and orders addressed:  1. Hyperlipidemia with target LDL less than 100 Low fat diet - atorvastatin (LIPITOR) 40 MG tablet; Take 1 tablet (40 mg total) by mouth daily. Needs to be seen for further refills  Dispense: 90 tablet; Refill: 1  2. Recurrent major depressive disorder, in full remission ([place]) Stress management - escitalopram (LEXAPRO) 20 MG tablet; Take 1 tablet (20 mg total) by mouth daily.  Dispense: 90 tablet; Refill: 1  3. BMI 33.0-33.9,adult Discussed diet and exercise for person with BMI >25 Will recheck weight in 3-6 months   4. Obstructive sleep apnea syndrome Keep appointment for cpap titration   Labs pending Health Maintenance reviewed Diet and exercise  encouraged  Follow up plan: 6 months   [name], FNP

## 2022-06-23 NOTE — Addendum Note (Signed)
Addended by: Chevis Pretty on: 06/23/2022 09:05 AM   Modules accepted: Orders

## 2022-06-25 ENCOUNTER — Encounter: Payer: Self-pay | Admitting: Nurse Practitioner

## 2022-06-25 ENCOUNTER — Ambulatory Visit: Payer: BC Managed Care – PPO | Admitting: Nurse Practitioner

## 2022-06-25 DIAGNOSIS — U071 COVID-19: Secondary | ICD-10-CM | POA: Diagnosis not present

## 2022-06-25 MED ORDER — BENZONATATE 100 MG PO CAPS: 100.0000 mg | ORAL_CAPSULE | Freq: Three times a day (TID) | ORAL | 0 refills | Status: DC | PRN

## 2022-06-25 MED ORDER — MOLNUPIRAVIR EUA 200MG CAPSULE: 4.0000 | ORAL_CAPSULE | Freq: Two times a day (BID) | ORAL | 0 refills | Status: AC

## 2022-06-25 MED ORDER — GUAIFENESIN ER 600 MG PO TB12: 600.0000 mg | ORAL_TABLET | Freq: Two times a day (BID) | ORAL | 0 refills | Status: DC

## 2022-06-25 NOTE — Progress Notes (Signed)
   Virtual Visit  Note Due to COVID-19 pandemic this visit was conducted virtually. This visit type was conducted due to national recommendations for restrictions regarding the COVID-19 Pandemic (e.g. social distancing, sheltering in place) in an effort to limit this patient's exposure and mitigate transmission in our community. All issues noted in this document were discussed and addressed.  A physical exam was not performed with this format.  I connected with Mikayla Knight on 06/25/22 at 3:30 pm  by telephone and verified that I am speaking with the correct person using two identifiers. Mikayla Knight is currently located at home during visit. The provider, Ivy Lynn, NP is located in their office at time of visit.  I discussed the limitations, risks, security and privacy concerns of performing an evaluation and management service by telephone and the availability of in person appointments. I also discussed with the patient that there may be a patient responsible charge related to this service. The patient expressed understanding and agreed to proceed.   History and Present Illness:  Cough This is a new problem. The current episode started yesterday. The problem has been unchanged. The problem occurs constantly. The cough is Productive of sputum. Associated symptoms include a fever, nasal congestion and sweats. Pertinent negatives include no chest pain, chills, ear congestion, rash, sore throat or shortness of breath. Nothing aggravates the symptoms.      Review of Systems  Constitutional:  Positive for fever. Negative for chills.  HENT:  Positive for congestion. Negative for sore throat.   Respiratory:  Positive for cough. Negative for shortness of breath.   Cardiovascular:  Negative for chest pain.  Skin: Negative.  Negative for itching and rash.  All other systems reviewed and are negative.    Observations/Objective: Tele-visit, patient is not in distress  Assessment and  Plan: Patient positive for covid-19 Take meds as prescribed - Use a cool mist humidifier  -Use saline nose sprays frequently -Force fluids -For fever or aches or pains- take Tylenol or ibuprofen. -Molnupirivir antiviral education provided to patient, medication sent to pharmacy -Tessalon pearls for cough -Muccinex as needed for cough and congestion - Follow up with worsening unresolved symptoms   Follow Up Instructions: Follow up with worsening unresolved symptoms    I discussed the assessment and treatment plan with the patient. The patient was provided an opportunity to ask questions and all were answered. The patient agreed with the plan and demonstrated an understanding of the instructions.   The patient was advised to call back or seek an in-person evaluation if the symptoms worsen or if the condition fails to improve as anticipated.  The above assessment and management plan was discussed with the patient. The patient verbalized understanding of and has agreed to the management plan. Patient is aware to call the clinic if symptoms persist or worsen. Patient is aware when to return to the clinic for a follow-up visit. Patient educated on when it is appropriate to go to the emergency department.   Time call ended:  3:41 pm  I provided 11 minutes of  non face-to-face time during this encounter.    Ivy Lynn, NP

## 2022-06-26 ENCOUNTER — Telehealth: Payer: Self-pay | Admitting: Nurse Practitioner

## 2022-06-26 NOTE — Telephone Encounter (Signed)
Patient seen on call provider for COVID and the was given molnupiravir she wants to know why plaxovid was not perscribed and will this one help her like the other one did when she had COVID only wants mmm to address only

## 2022-06-26 NOTE — Telephone Encounter (Signed)
Not sure why given molnupivir instead of paxlovid- but should work just as well.

## 2022-06-26 NOTE — Telephone Encounter (Signed)
Patient aware.

## 2022-06-26 NOTE — Telephone Encounter (Signed)
Pt called requesting to speak with MMM or nurse regarding medicine she was prescribed yesterday to take for COVID.

## 2022-07-11 ENCOUNTER — Ambulatory Visit: Payer: BC Managed Care – PPO | Attending: Cardiovascular Disease | Admitting: Cardiovascular Disease

## 2022-07-11 DIAGNOSIS — G4733 Obstructive sleep apnea (adult) (pediatric): Secondary | ICD-10-CM

## 2022-07-11 DIAGNOSIS — G4730 Sleep apnea, unspecified: Secondary | ICD-10-CM | POA: Diagnosis not present

## 2022-07-14 ENCOUNTER — Encounter: Payer: Self-pay | Admitting: Cardiovascular Disease

## 2022-07-14 NOTE — Procedures (Signed)
[facility]        Patient Name: Mikayla Knight, Ruppert Date: 07/11/2022 Gender: Female D.O.B: 06/20/1951 Age (years): 66 Referring Provider: Kirk Ruths Height (inches): 66 Interpreting Physician: Shelva Majestic MD, ABSM Weight (lbs): 211 RPSGT: Peak, Robert BMI: 34 MRN: [id number] Neck Size: 16.00  CLINICAL INFORMATION The patient is referred for a BiPAP titration to treat sleep apnea.  Date of NPSG: 03/10/2022: AHI 17.8/h; RDI 23.6/h; REM AHI 53.8/h; O2 nadir 83%; loud snoring  SLEEP STUDY TECHNIQUE As per the AASM Manual for the Scoring of Sleep and Associated Events v2.3 (April 2016) with a hypopnea requiring 4% desaturations.  The channels recorded and monitored were frontal, central and occipital EEG, electrooculogram (EOG), submentalis EMG (chin), nasal and oral airflow, thoracic and abdominal wall motion, anterior tibialis EMG, snore microphone, electrocardiogram, and pulse oximetry. Bilevel positive airway pressure (BPAP) was initiated at the beginning of the study and titrated to treat sleep-disordered breathing.  MEDICATIONS atorvastatin (LIPITOR) 40 MG tablet benzonatate (TESSALON PERLES) 100 MG capsule cyclobenzaprine (FLEXERIL) 10 MG tablet escitalopram (LEXAPRO) 20 MG tablet guaiFENesin (MUCINEX) 600 MG 12 hr tablet Triamcinolone Acetonide (TRIAMCINOLONE 0.1 % CREAM : EUCERIN) CREA valACYclovir (VALTREX) 1000 MG t Medications self-administered by patient taken the night of the study : N/A  RESPIRATORY PARAMETERS Optimal IPAP Pressure (cm):  AHI at Optimal Pressure (/hr) N/A Optimal EPAP Pressure (cm):   Overall Minimal O2 (%): 88.00 Minimal O2 at Optimal Pressure (%): 88.00  SLEEP ARCHITECTURE Start Time: 10:18:13 PM Stop Time: 5:39:16 AM Total Time (min): 441 Total Sleep Time (min): 337.5 Sleep Latency (min): 9.9 Sleep Efficiency (%): 76.5 REM Latency (min): 259.5 WASO  (min): 93.6 Stage N1 (%): 7.41 Stage N2 (%): 75.26 Stage N3 (%): 0.44 Stage R (%): 16.9 Supine (%): 67.56 Arousal Index (/hr): 9.4   CARDIAC DATA The 2 lead EKG demonstrated sinus rhythm. The mean heart rate was 68.80 beats per minute. Other EKG findings include: PVCs.  LEG MOVEMENT DATA The total Periodic Limb Movements of Sleep (PLMS) were 0. The PLMS index was 0.00. A PLMS index of <15 is considered normal in adults.  IMPRESSIONS - CPAP was initiated at 5 cm and was titrated to 15 cm of water. Due to continued events BiPAP was initiated at 16/12 and was titrated to 18/15. At 18/15, AHI was 0 but REM sleep was not achieved. - Mild Central Sleep Apnea was noted during this titration (CAI = 5.5/h). - Mild oxygen desaturations to a nadir of 88%. - No snoring was audible during this study. - 2-lead EKG demonstrated: PVCs - Clinically significant periodic limb movements were not noted during this study. Arousals associated with PLMs were rare.  DIAGNOSIS - Obstructive Sleep Apnea (G47.33)  RECOMMENDATIONS - Recommend an initial trial of Auto-BiPAP with EPAP min of 10, PS of 4, and IPAP max of 25 cm of water.  - Effort should be made to optimize nasal and oropharyngeal patency. - Avoid alcohol, sedatives and other CNS depressants that may worsen sleep apnea and disrupt normal sleep architecture. - Sleep hygiene should be reviewed to assess factors that may improve sleep  quality. - Weight management (BMI 34) and regular exercise should be initiated or continued. - Recommend a download and sleep clinic evaluation after 4 weeks of therapy.  [Electronically signed] 07/14/2022 11:02 AM  Shelva Majestic MD, [facility], [place], American Board of Sleep Medicine NPI: [id number]  [place] PH: [phone]   FX: [phone] [place]

## 2022-07-22 ENCOUNTER — Telehealth: Payer: Self-pay | Admitting: *Deleted

## 2022-07-22 NOTE — Telephone Encounter (Signed)
Notified patient BIPAP titration has been completed and a order has been given for her BIPAP machine. She states that she has a machine at home that was given to her by someone. She is not sure if it is a BIPAP or not. She was asked by me to call me tomorrow with the name and model of the device. I may be able to tell her. If not, she will need to takes it to a DME company and they can tell her. I will wait for a return call.

## 2022-07-22 NOTE — Telephone Encounter (Signed)
-----   Message from Troy Sine, MD sent at 07/14/2022 11:08 AM EDT ----- Mariann Laster, pleased notify pt and set up with DME for BiPAP initiation.

## 2022-07-23 NOTE — Telephone Encounter (Signed)
Patient called in today to give me the name of the machine she has that was given to her. She reports the machine as being a ResMed Aicurve 10 ASV. She was informed this is not the same machine. She agrees to proceed with me ordering the correct device per Dr Evette Georges orders. BIPAP orders sent to [facility] via [facility] portal.

## 2022-07-29 NOTE — Progress Notes (Deleted)
     HPI: FU dyspnea and palpitations.  Echocardiogram February 2023 showed normal LV function, mild mitral regurgitation.  Monitor February 2023 revealed.  Since last seen  Current Outpatient Medications  Medication Sig Dispense Refill   atorvastatin (LIPITOR) 40 MG tablet Take 1 tablet (40 mg total) by mouth daily. Needs to be seen for further refills 90 tablet 1   benzonatate (TESSALON PERLES) 100 MG capsule Take 1 capsule (100 mg total) by mouth 3 (three) times daily as needed. 20 capsule 0   cyclobenzaprine (FLEXERIL) 10 MG tablet Take 1 tablet (10 mg total) by mouth 3 (three) times daily as needed. for muscle spams 30 tablet 1   escitalopram (LEXAPRO) 20 MG tablet Take 1 tablet (20 mg total) by mouth daily. 90 tablet 1   guaiFENesin (MUCINEX) 600 MG 12 hr tablet Take 1 tablet (600 mg total) by mouth 2 (two) times daily. 30 tablet 0   Triamcinolone Acetonide (TRIAMCINOLONE 0.1 % CREAM : EUCERIN) CREA Apply 1 application topically 2 (two) times daily. 1 each 2   valACYclovir (VALTREX) 1000 MG tablet Take 1 tablet (1,000 mg total) by mouth 3 (three) times daily. 90 tablet 1   No current facility-administered medications for this visit.     Past Medical History:  Diagnosis Date   Depression    Hyperlipidemia    Internal hemorrhoids     Past Surgical History:  Procedure Laterality Date   COLONOSCOPY  04-01-2004    Social History   Socioeconomic History   Marital status: Divorced    Spouse name: Not on file   Number of children: 3   Years of education: Not on file   Highest education level: Not on file  Occupational History    Employer: [employer]  Tobacco Use   Smoking status: Never   Smokeless tobacco: Never  Substance and Sexual Activity   Alcohol use: Yes    Comment: Occasional   Drug use: No   Sexual activity: Not on file  Other Topics Concern   Not on file  Social History Narrative   Not on file   Social Determinants of Health   Financial  Resource Strain: Not on file  Food Insecurity: Not on file  Transportation Needs: Not on file  Physical Activity: Not on file  Stress: Not on file  Social Connections: Not on file  Intimate Partner Violence: Not on file    Family History  Problem Relation Age of Onset   Ovarian cancer Mother     ROS: no fevers or chills, productive cough, hemoptysis, dysphasia, odynophagia, melena, hematochezia, dysuria, hematuria, rash, seizure activity, orthopnea, PND, pedal edema, claudication. Remaining systems are negative.  Physical Exam: Well-developed well-nourished in no acute distress.  Skin is warm and dry.  HEENT is normal.  Neck is supple.  Chest is clear to auscultation with normal expansion.  Cardiovascular exam is regular rate and rhythm.  Abdominal exam nontender or distended. No masses palpated. Extremities show no edema. neuro grossly intact  ECG- personally reviewed  A/P  1 dyspnea-echocardiogram showed normal LV function.  There is likely component of obesity and deconditioning.  She has also been diagnosed with sleep apnea in Dr. Evette Georges managing this.  2 hyperlipidemia-continue statin.  3 palpitations-  Kirk Ruths, MD

## 2022-08-06 ENCOUNTER — Encounter: Payer: Self-pay | Admitting: *Deleted

## 2022-08-08 ENCOUNTER — Ambulatory Visit: Payer: BC Managed Care – PPO | Admitting: Cardiology

## 2022-08-11 ENCOUNTER — Encounter: Payer: Self-pay | Admitting: Cardiology

## 2022-08-11 ENCOUNTER — Ambulatory Visit: Payer: BC Managed Care – PPO | Attending: Cardiology | Admitting: Cardiology

## 2022-08-11 VITALS — BP 130/80 | HR 79 | Ht 66.0 in | Wt 206.0 lb

## 2022-08-11 DIAGNOSIS — G4733 Obstructive sleep apnea (adult) (pediatric): Secondary | ICD-10-CM

## 2022-08-11 DIAGNOSIS — R0602 Shortness of breath: Secondary | ICD-10-CM | POA: Diagnosis not present

## 2022-08-11 DIAGNOSIS — R002 Palpitations: Secondary | ICD-10-CM

## 2022-08-11 NOTE — Patient Instructions (Signed)
    Follow-Up: At [facility], you and your health needs are our priority.  As part of our continuing mission to provide you with exceptional heart care, we have created designated Provider Care Teams.  These Care Teams include your primary Cardiologist (physician) and Advanced Practice Providers (APPs -  Physician Assistants and Nurse Practitioners) who all work together to provide you with the care you need, when you need it.  We recommend signing up for the patient portal called "MyChart".  Sign up information is provided on this After Visit Summary.  MyChart is used to connect with patients for Virtual Visits (Telemedicine).  Patients are able to view lab/test results, encounter notes, upcoming appointments, etc.  Non-urgent messages can be sent to your provider as well.   To learn more about what you can do with MyChart, go to [email or web address].    Your next appointment:   12 month(s)  The format for your next appointment:   In Person  Provider:   Brian Crenshaw MD          

## 2022-08-11 NOTE — Progress Notes (Signed)
HPI: FU dyspnea and palpitations. Echocardiogram February 2023 showed normal LV function, mild mitral regurgitation.  Monitor ordered at last office visit but not performed.  Since last seen she had 1 episode of "anxiety attack" yesterday with her heart fluttering and associated dyspnea.  Otherwise she has done well.  She has mild dyspnea on exertion but no orthopnea, PND, pedal edema, chest pain or syncope.  Current Outpatient Medications  Medication Sig Dispense Refill   atorvastatin (LIPITOR) 40 MG tablet Take 1 tablet (40 mg total) by mouth daily. Needs to be seen for further refills 90 tablet 1   benzonatate (TESSALON PERLES) 100 MG capsule Take 1 capsule (100 mg total) by mouth 3 (three) times daily as needed. 20 capsule 0   cyclobenzaprine (FLEXERIL) 10 MG tablet Take 1 tablet (10 mg total) by mouth 3 (three) times daily as needed. for muscle spams 30 tablet 1   escitalopram (LEXAPRO) 20 MG tablet Take 1 tablet (20 mg total) by mouth daily. 90 tablet 1   guaiFENesin (MUCINEX) 600 MG 12 hr tablet Take 1 tablet (600 mg total) by mouth 2 (two) times daily. 30 tablet 0   Triamcinolone Acetonide (TRIAMCINOLONE 0.1 % CREAM : EUCERIN) CREA Apply 1 application topically 2 (two) times daily. 1 each 2   valACYclovir (VALTREX) 1000 MG tablet Take 1 tablet (1,000 mg total) by mouth 3 (three) times daily. 90 tablet 1   No current facility-administered medications for this visit.     Past Medical History:  Diagnosis Date   Depression    Hyperlipidemia    Internal hemorrhoids     Past Surgical History:  Procedure Laterality Date   COLONOSCOPY  04-01-2004    Social History   Socioeconomic History   Marital status: Divorced    Spouse name: Not on file   Number of children: 3   Years of education: Not on file   Highest education level: Not on file  Occupational History    Employer: [employer]  Tobacco Use   Smoking status: Never   Smokeless tobacco: Never   Substance and Sexual Activity   Alcohol use: Yes    Comment: Occasional   Drug use: No   Sexual activity: Not on file  Other Topics Concern   Not on file  Social History Narrative   Not on file   Social Determinants of Health   Financial Resource Strain: Not on file  Food Insecurity: Not on file  Transportation Needs: Not on file  Physical Activity: Not on file  Stress: Not on file  Social Connections: Not on file  Intimate Partner Violence: Not on file    Family History  Problem Relation Age of Onset   Ovarian cancer Mother     ROS: no fevers or chills, productive cough, hemoptysis, dysphasia, odynophagia, melena, hematochezia, dysuria, hematuria, rash, seizure activity, orthopnea, PND, pedal edema, claudication. Remaining systems are negative.  Physical Exam: Well-developed well-nourished in no acute distress.  Skin is warm and dry.  HEENT is normal.  Neck is supple.  Chest is clear to auscultation with normal expansion.  Cardiovascular exam is regular rate and rhythm.  Abdominal exam nontender or distended. No masses palpated. Extremities show no edema. neuro grossly intact  ECG-normal sinus rhythm at a rate of 79, no ST changes.  Personally reviewed  A/P  1 dyspnea-LV function normal on echocardiogram.  Likely secondary to obesity/deconditioning and sleep apnea which is now being treated.  2 palpitations-had an episode yesterday transiently  but otherwise has done well.  She declined to wear the monitor previously due to expense.  We discussed the potential for an Apple watch to record rhythm strips at time of symptoms.  We will review any strips that she forwards to [place].  3 hyperlipidemia-Per primary care.  Continue statin.  4 obstructive sleep apnea-managed by Dr. Claiborne Billings.  Kirk Ruths, MD

## 2022-08-12 DIAGNOSIS — M79675 Pain in left toe(s): Secondary | ICD-10-CM | POA: Diagnosis not present

## 2022-08-12 DIAGNOSIS — M7742 Metatarsalgia, left foot: Secondary | ICD-10-CM | POA: Diagnosis not present

## 2022-08-18 DIAGNOSIS — G4733 Obstructive sleep apnea (adult) (pediatric): Secondary | ICD-10-CM | POA: Diagnosis not present

## 2022-08-26 ENCOUNTER — Ambulatory Visit (INDEPENDENT_AMBULATORY_CARE_PROVIDER_SITE_OTHER): Payer: BC Managed Care – PPO

## 2022-08-26 ENCOUNTER — Encounter: Payer: Self-pay | Admitting: Nurse Practitioner

## 2022-08-26 ENCOUNTER — Ambulatory Visit: Payer: BC Managed Care – PPO | Admitting: Nurse Practitioner

## 2022-08-26 VITALS — BP 114/62 | HR 74 | Temp 97.6°F | Resp 20 | Ht 66.0 in | Wt 212.0 lb

## 2022-08-26 DIAGNOSIS — M79672 Pain in left foot: Secondary | ICD-10-CM | POA: Diagnosis not present

## 2022-08-26 DIAGNOSIS — M19072 Primary osteoarthritis, left ankle and foot: Secondary | ICD-10-CM | POA: Diagnosis not present

## 2022-08-26 MED ORDER — CELECOXIB 200 MG PO CAPS: 200.0000 mg | ORAL_CAPSULE | Freq: Two times a day (BID) | ORAL | 0 refills | Status: DC

## 2022-08-26 NOTE — Progress Notes (Signed)
   Subjective:    Patient ID: Mikayla Knight, female    DOB: August 06, 1951, 71 y.o.   MRN: [id number]   Chief Complaint: Foot Pain (Left foot. No injury)   Pt seen today for L foot pain.  Foot Pain This is a new problem. The current episode started more than 1 month ago. The problem occurs every several days. The problem has been unchanged. Pertinent negatives include no numbness or weakness. Associated symptoms comments: none. Nothing aggravates the symptoms. She has tried NSAIDs for the symptoms. The treatment provided mild relief.       Review of Systems  Musculoskeletal:        Mikayla Knight of L foot hurts intermittently; some days it hurts and some days it does not. Pain feels like needle pricks  Neurological:  Negative for dizziness, weakness and numbness.  All other systems reviewed and are negative.      Objective:   Physical Exam Vitals and nursing note reviewed.  Constitutional:      Appearance: Normal appearance.  Musculoskeletal:     Left foot: No swelling, deformity, tenderness or bony tenderness.  Feet:     Left foot:     Skin integrity: No erythema or warmth.     Comments: Pain on bottom of foot between second and third toes Neurological:     General: No focal deficit present.     Mental Status: She is alert and oriented to person, place, and time.  Psychiatric:        Mood and Affect: Mood normal.        Behavior: Behavior normal.        Thought Content: Thought content normal.        Judgment: Judgment normal.   BP 114/62   Pulse 74   Temp 97.6 F (36.4 C) (Temporal)   Resp 20   Ht '5\' 6"'$  (1.676 m)   Wt 212 lb (96.2 kg)   SpO2 97%   BMI 34.22 kg/m          Assessment & Plan:   Mikayla Knight in today with chief complaint of Foot Pain (Left foot. No injury)   1. Left foot pain Rest. Ice. Celebrex - DG Foot Complete Left  Meds ordered this encounter  Medications   celecoxib (CELEBREX) 200 MG capsule    Sig: Take 1 capsule (200 mg total) by  mouth 2 (two) times daily.    Dispense:  60 capsule    Refill:  0    Order Specific Question:   Supervising Provider    Answer:   Caryl Pina A [[id number]]     The above assessment and management plan was discussed with the patient. The patient verbalized understanding of and has agreed to the management plan. Patient is aware to call the clinic if symptoms persist or worsen. Patient is aware when to return to the clinic for a follow-up visit. Patient educated on when it is appropriate to go to the emergency department.   [name], FNP

## 2022-08-26 NOTE — Patient Instructions (Signed)

## 2022-09-09 ENCOUNTER — Other Ambulatory Visit: Payer: Self-pay | Admitting: Nurse Practitioner

## 2022-09-09 DIAGNOSIS — B009 Herpesviral infection, unspecified: Secondary | ICD-10-CM

## 2022-09-17 DIAGNOSIS — G4733 Obstructive sleep apnea (adult) (pediatric): Secondary | ICD-10-CM | POA: Diagnosis not present

## 2022-09-22 ENCOUNTER — Other Ambulatory Visit: Payer: Self-pay | Admitting: Nurse Practitioner

## 2022-09-24 ENCOUNTER — Other Ambulatory Visit: Payer: Self-pay | Admitting: Nurse Practitioner

## 2022-09-24 DIAGNOSIS — Z1231 Encounter for screening mammogram for malignant neoplasm of breast: Secondary | ICD-10-CM

## 2022-09-29 ENCOUNTER — Inpatient Hospital Stay: Admission: RE | Admit: 2022-09-29 | Payer: BC Managed Care – PPO | Source: Ambulatory Visit

## 2022-10-18 DIAGNOSIS — G4733 Obstructive sleep apnea (adult) (pediatric): Secondary | ICD-10-CM | POA: Diagnosis not present

## 2022-10-20 ENCOUNTER — Ambulatory Visit
Admission: RE | Admit: 2022-10-20 | Discharge: 2022-10-20 | Disposition: A | Discharge status: Home / Self Care | Payer: BC Managed Care – PPO | Source: Ambulatory Visit | Attending: Nurse Practitioner | Admitting: Nurse Practitioner

## 2022-10-20 DIAGNOSIS — Z1231 Encounter for screening mammogram for malignant neoplasm of breast: Secondary | ICD-10-CM | POA: Diagnosis not present

## 2022-11-07 ENCOUNTER — Encounter: Payer: Self-pay | Admitting: Family Medicine

## 2022-11-07 ENCOUNTER — Ambulatory Visit (INDEPENDENT_AMBULATORY_CARE_PROVIDER_SITE_OTHER): Payer: BC Managed Care – PPO | Admitting: Family Medicine

## 2022-11-07 VITALS — BP 125/74 | HR 96 | Temp 98.1°F | Ht 66.0 in | Wt 215.4 lb

## 2022-11-07 DIAGNOSIS — M545 Low back pain, unspecified: Secondary | ICD-10-CM

## 2022-11-07 DIAGNOSIS — M7061 Trochanteric bursitis, right hip: Secondary | ICD-10-CM | POA: Diagnosis not present

## 2022-11-07 MED ORDER — PREDNISONE 20 MG PO TABS: 20.0000 mg | ORAL_TABLET | Freq: Every day | ORAL | 0 refills | Status: AC

## 2022-11-07 NOTE — Patient Instructions (Signed)
Hip Bursitis Rehab Ask your health care provider which exercises are safe for you. Do exercises exactly as told by your health care provider and adjust them as directed. It is normal to feel mild stretching, pulling, tightness, or discomfort as you do these exercises. Stop right away if you feel sudden pain or your pain gets worse. Do not begin these exercises until told by your health care provider. Stretching exercise This exercise warms up your muscles and joints and improves the movement and flexibility of your hip. This exercise also helps to relieve pain and stiffness. Iliotibial band stretch An iliotibial band is a strong band of muscle tissue that runs from the outer side of your hip to the outer side of your thigh and knee. Lie on your side with your left / right leg in the top position. Bend your left / right knee and grab your ankle. Stretch out your bottom arm to help you balance. Slowly bring your knee back so your thigh is slightly behind your body. Slowly lower your knee toward the floor until you feel a gentle stretch on the outside of your left / right thigh. If you do not feel a stretch and your knee will not lower more toward the floor, place the heel of your other foot on top of your knee and pull your knee down toward the floor with your foot. Hold this position for __________ seconds. Slowly return to the starting position. Repeat __________ times. Complete this exercise __________ times a day. Strengthening exercises These exercises build strength and endurance in your hip and pelvis. Endurance is the ability to use your muscles for a long time, even after they get tired. Bridge This exercise strengthens the muscles that move your thigh backward (hip extensors). Lie on your back on a firm surface with your knees bent and your feet flat on the floor. Tighten your buttocks muscles and lift your buttocks off the floor until your trunk is level with your thighs. Do not arch your  back. You should feel the muscles working in your buttocks and the back of your thighs. If you do not feel these muscles, slide your feet 1-2 inches (2.5-5 cm) farther away from your buttocks. If this exercise is too easy, try doing it with your arms crossed over your chest. Hold this position for __________ seconds. Slowly lower your hips to the starting position. Let your muscles relax completely after each repetition. Repeat __________ times. Complete this exercise __________ times a day. Squats This exercise strengthens the muscles in front of your thigh and knee (quadriceps). Stand in front of a table, with your feet and knees pointing straight ahead. You may rest your hands on the table for balance but not for support. Slowly bend your knees and lower your hips like you are going to sit in a chair. Keep your weight over your heels, not over your toes. Keep your lower legs upright so they are parallel with the table legs. Do not let your hips go lower than your knees. Do not bend lower than told by your health care provider. If your hip pain increases, do not bend as low. Hold the squat position for __________ seconds. Slowly push with your legs to return to standing. Do not use your hands to pull yourself to standing. Repeat __________ times. Complete this exercise __________ times a day. Hip hike  Stand sideways on a bottom step. Stand on your left / right leg with your other foot unsupported next to   the step. You can hold on to the railing or wall for balance if needed. Keep your knees straight and your torso square. Then lift your left / right hip up toward the ceiling. Hold this position for __________ seconds. Slowly let your left / right hip lower toward the floor, past the starting position. Your foot should get closer to the floor. Do not lean or bend your knees. Repeat __________ times. Complete this exercise __________ times a day. Single leg stand This exercise increases  your balance. Without shoes, stand near a railing or in a doorway. You may hold on to the railing or door frame as needed for balance. Squeeze your left / right buttock muscles, then lift up your other foot. Do not let your left / right hip push out to the side. It is helpful to stand in front of a mirror for this exercise so you can watch your hip. Hold this position for __________ seconds. Repeat __________ times. Complete this exercise __________ times a day. This information is not intended to replace advice given to you by your health care provider. Make sure you discuss any questions you have with your health care provider. Document Revised: 10/23/2021 Document Reviewed: 10/23/2021 Elsevier Patient Education  2023 [employer] Inc.  

## 2022-11-07 NOTE — Progress Notes (Signed)
   Acute Office Visit  Subjective:     Patient ID: Mikayla Knight, female    DOB: 14-Nov-1951, 71 y.o.   MRN: [id number]  Chief Complaint  Patient presents with   Leg Pain    HPI Patient is in today for right groin pain for 1-2 weeks. The pain is intermittent. It is radiates down her thigh sometimes or to her buttocks. The pain is aggravated by walking, going from sitting to standing, when lifting her leg, or crossing her leg. The pain is a 4-5/10. She does have a history of lumbar DDD and protruding discs in her lower back. She has been having right sided lower back pain as well. She denies changes in bowel or bladder control, saddle anesthesia, injury, or fever. She denies urinary symptoms, abdominal pain, nausea, or vomiting.    ROS As per HPI.      Objective:    BP 125/74   Pulse 96   Temp 98.1 F (36.7 C) (Temporal)   Ht '5\' 6"'$  (1.676 m)   Wt 215 lb 6 oz (97.7 kg)   SpO2 94%   BMI 34.76 kg/m    Physical Exam Pulmonary:     Effort: Pulmonary effort is normal. No respiratory distress.     Breath sounds: Normal breath sounds.  Abdominal:     General: Bowel sounds are normal. There is no distension.     Palpations: Abdomen is soft.     Tenderness: There is no abdominal tenderness. There is no guarding or rebound.  Musculoskeletal:     Lumbar back: Tenderness (right paraspinal) present. No swelling, edema, signs of trauma or bony tenderness. Negative right straight leg raise test and negative left straight leg raise test.     Right hip: Tenderness (over greater trocanter) present. No bony tenderness or crepitus. Normal range of motion.     Right lower leg: No edema.     Left lower leg: No edema.  Skin:    General: Skin is warm and dry.  Neurological:     Motor: No weakness.     Gait: Gait normal.  Psychiatric:        Mood and Affect: Mood normal.        Behavior: Behavior normal.     No results found for any visits on 11/07/22.      Assessment & Plan:    Mikayla Knight was seen today for leg pain.  Diagnoses and all orders for this visit:  Acute right-sided low back pain without sciatica Prednisone burst as below. No red flags. Discussed rest, ice, heat, NSAIDs, tylenol, stretching. -     predniSONE (DELTASONE) 20 MG tablet; Take 1 tablet (20 mg total) by mouth daily for 5 days.  Trochanteric bursitis of right hip Discussed injection with another provider. She prefers to try a prednisone burst for now. Discussed rest, ice, heat, NSAIDs, tylenol, stretching. -     predniSONE (DELTASONE) 20 MG tablet; Take 1 tablet (20 mg total) by mouth daily for 5 days.  Return if symptoms worsen or fail to improve.  The patient indicates understanding of these issues and agrees with the plan.   Gwenlyn Perking, FNP

## 2022-11-17 DIAGNOSIS — G4733 Obstructive sleep apnea (adult) (pediatric): Secondary | ICD-10-CM | POA: Diagnosis not present

## 2022-12-07 ENCOUNTER — Other Ambulatory Visit: Payer: Self-pay | Admitting: Nurse Practitioner

## 2022-12-18 DIAGNOSIS — G4733 Obstructive sleep apnea (adult) (pediatric): Secondary | ICD-10-CM | POA: Diagnosis not present

## 2022-12-23 ENCOUNTER — Ambulatory Visit: Payer: BC Managed Care – PPO | Admitting: Nurse Practitioner

## 2022-12-23 NOTE — Progress Notes (Deleted)
   Subjective:    Patient ID: Mikayla Knight, female    DOB: 1951/01/22, 72 y.o.   MRN: [id number]   Chief Complaint: medical management of chronic issues     HPI:  Mikayla Knight is a 72 y.o. who identifies as a female who was assigned female at birth.   Social history: Lives with: by herself Work history: [employer] in today for follow up of the following chronic medical issues:  1. Hyperlipidemia with target LDL less than 100 Does not watch diet very closely and does no dedicated exercise. Lab Results  Component Value Date   CHOL 171 06/23/2022   HDL 42 06/23/2022   LDLCALC 105 (H) 06/23/2022   TRIG 134 06/23/2022   CHOLHDL 4.1 06/23/2022     2. Recurrent major depressive disorder, in full remission ([place]) Is on lexapro and is doing well.  3. BMI 33.0-33.9,adult No recent weight changes   New complaints: ***  No Known Allergies Outpatient Encounter Medications as of 12/23/2022  Medication Sig   atorvastatin (LIPITOR) 40 MG tablet Take 1 tablet (40 mg total) by mouth daily. Needs to be seen for further refills   celecoxib (CELEBREX) 200 MG capsule Take 1 capsule by mouth twice daily   cyclobenzaprine (FLEXERIL) 10 MG tablet Take 1 tablet (10 mg total) by mouth 3 (three) times daily as needed. for muscle spams   escitalopram (LEXAPRO) 20 MG tablet Take 1 tablet (20 mg total) by mouth daily.   terbinafine (LAMISIL) 250 MG tablet Take 250 mg by mouth daily.   Triamcinolone Acetonide (TRIAMCINOLONE 0.1 % CREAM : EUCERIN) CREA Apply 1 application topically 2 (two) times daily.   valACYclovir (VALTREX) 1000 MG tablet TAKE 1 TABLET BY MOUTH THREE TIMES DAILY   No facility-administered encounter medications on file as of 12/23/2022.    Past Surgical History:  Procedure Laterality Date   COLONOSCOPY  04-01-2004    Family History  Problem Relation Age of Onset   Ovarian cancer Mother    Breast cancer Neg Hx       Controlled substance  contract: ***     Review of Systems     Objective:   Physical Exam        Assessment & Plan:

## 2023-01-15 ENCOUNTER — Encounter: Payer: Self-pay | Admitting: Nurse Practitioner

## 2023-01-15 ENCOUNTER — Ambulatory Visit: Payer: BC Managed Care – PPO | Admitting: Nurse Practitioner

## 2023-01-15 VITALS — BP 118/69 | HR 87 | Temp 98.1°F | Resp 20 | Ht 66.0 in | Wt 216.0 lb

## 2023-01-15 DIAGNOSIS — Z23 Encounter for immunization: Secondary | ICD-10-CM | POA: Diagnosis not present

## 2023-01-15 DIAGNOSIS — F3342 Major depressive disorder, recurrent, in full remission: Secondary | ICD-10-CM | POA: Diagnosis not present

## 2023-01-15 DIAGNOSIS — M5441 Lumbago with sciatica, right side: Secondary | ICD-10-CM

## 2023-01-15 DIAGNOSIS — E785 Hyperlipidemia, unspecified: Secondary | ICD-10-CM

## 2023-01-15 DIAGNOSIS — G4733 Obstructive sleep apnea (adult) (pediatric): Secondary | ICD-10-CM | POA: Diagnosis not present

## 2023-01-15 DIAGNOSIS — K921 Melena: Secondary | ICD-10-CM

## 2023-01-15 DIAGNOSIS — K219 Gastro-esophageal reflux disease without esophagitis: Secondary | ICD-10-CM

## 2023-01-15 DIAGNOSIS — Z6833 Body mass index (BMI) 33.0-33.9, adult: Secondary | ICD-10-CM

## 2023-01-15 MED ORDER — ESCITALOPRAM OXALATE 20 MG PO TABS: 20.0000 mg | ORAL_TABLET | Freq: Every day | ORAL | 1 refills | Status: DC

## 2023-01-15 MED ORDER — OMEPRAZOLE 20 MG PO CPDR: 20.0000 mg | DELAYED_RELEASE_CAPSULE | Freq: Every day | ORAL | 1 refills | Status: DC

## 2023-01-15 MED ORDER — CYCLOBENZAPRINE HCL 10 MG PO TABS: 10.0000 mg | ORAL_TABLET | Freq: Three times a day (TID) | ORAL | 1 refills | Status: DC | PRN

## 2023-01-15 MED ORDER — PREDNISONE 10 MG (21) PO TBPK: ORAL_TABLET | ORAL | 0 refills | Status: DC

## 2023-01-15 MED ORDER — ATORVASTATIN CALCIUM 40 MG PO TABS: 40.0000 mg | ORAL_TABLET | Freq: Every day | ORAL | 1 refills | Status: DC

## 2023-01-15 NOTE — Addendum Note (Signed)
Addended by: Chevis Pretty on: 01/15/2023 04:24 PM   Modules accepted: Level of Service

## 2023-01-15 NOTE — Patient Instructions (Signed)
Back Exercises These exercises help to make your trunk and back strong. They also help to keep the lower back flexible. Doing these exercises can help to prevent or lessen pain in your lower back. If you have back pain, try to do these exercises 2-3 times each day or as told by your doctor. As you get better, do the exercises once each day. Repeat the exercises more often as told by your doctor. To stop back pain from coming back, do the exercises once each day, or as told by your doctor. Do exercises exactly as told by your doctor. Stop right away if you feel sudden pain or your pain gets worse. Exercises Single knee to chest Do these steps 3-5 times in a row for each leg: Lie on your back on a firm bed or the floor with your legs stretched out. Bring one knee to your chest. Grab your knee or thigh with both hands and hold it in place. Pull on your knee until you feel a gentle stretch in your lower back or butt. Keep doing the stretch for 10-30 seconds. Slowly let go of your leg and straighten it. Pelvic tilt Do these steps 5-10 times in a row: Lie on your back on a firm bed or the floor with your legs stretched out. Bend your knees so they point up to the ceiling. Your feet should be flat on the floor. Tighten your lower belly (abdomen) muscles to press your lower back against the floor. This will make your tailbone point up to the ceiling instead of pointing down to your feet or the floor. Stay in this position for 5-10 seconds while you gently tighten your muscles and breathe evenly. Cat-cow Do these steps until your lower back bends more easily: Get on your hands and knees on a firm bed or the floor. Keep your hands under your shoulders, and keep your knees under your hips. You may put padding under your knees. Let your head hang down toward your chest. Tighten (contract) the muscles in your belly. Point your tailbone toward the floor so your lower back becomes rounded like the back of a  cat. Stay in this position for 5 seconds. Slowly lift your head. Let the muscles of your belly relax. Point your tailbone up toward the ceiling so your back forms a sagging arch like the back of a cow. Stay in this position for 5 seconds.  Press-ups Do these steps 5-10 times in a row: Lie on your belly (face-down) on a firm bed or the floor. Place your hands near your head, about shoulder-width apart. While you keep your back relaxed and keep your hips on the floor, slowly straighten your arms to raise the top half of your body and lift your shoulders. Do not use your back muscles. You may change where you place your hands to make yourself more comfortable. Stay in this position for 5 seconds. Keep your back relaxed. Slowly return to lying flat on the floor.  Bridges Do these steps 10 times in a row: Lie on your back on a firm bed or the floor. Bend your knees so they point up to the ceiling. Your feet should be flat on the floor. Your arms should be flat at your sides, next to your body. Tighten your butt muscles and lift your butt off the floor until your waist is almost as high as your knees. If you do not feel the muscles working in your butt and the back of  your thighs, slide your feet 1-2 inches (2.5-5 cm) farther away from your butt. Stay in this position for 3-5 seconds. Slowly lower your butt to the floor, and let your butt muscles relax. If this exercise is too easy, try doing it with your arms crossed over your chest. Belly crunches Do these steps 5-10 times in a row: Lie on your back on a firm bed or the floor with your legs stretched out. Bend your knees so they point up to the ceiling. Your feet should be flat on the floor. Cross your arms over your chest. Tip your chin a little bit toward your chest, but do not bend your neck. Tighten your belly muscles and slowly raise your chest just enough to lift your shoulder blades a tiny bit off the floor. Avoid raising your body  higher than that because it can put too much stress on your lower back. Slowly lower your chest and your head to the floor. Back lifts Do these steps 5-10 times in a row: Lie on your belly (face-down) with your arms at your sides, and rest your forehead on the floor. Tighten the muscles in your legs and your butt. Slowly lift your chest off the floor while you keep your hips on the floor. Keep the back of your head in line with the curve in your back. Look at the floor while you do this. Stay in this position for 3-5 seconds. Slowly lower your chest and your face to the floor. Contact a doctor if: Your back pain gets a lot worse when you do an exercise. Your back pain does not get better within 2 hours after you exercise. If you have any of these problems, stop doing the exercises. Do not do them again unless your doctor says it is okay. Get help right away if: You have sudden, very bad back pain. If this happens, stop doing the exercises. Do not do them again unless your doctor says it is okay. This information is not intended to replace advice given to you by your health care provider. Make sure you discuss any questions you have with your health care provider. Document Revised: 01/23/2021 Document Reviewed: 01/23/2021 Elsevier Patient Education  [place].

## 2023-01-15 NOTE — Progress Notes (Signed)
Subjective:    Patient ID: Mikayla Knight, female    DOB: May 30, 1951, 72 y.o.   MRN: [id number]   Chief Complaint: medical management of chronic issues     HPI:  Goldye Knight is a 72 y.o. who identifies as a female who was assigned female at birth.   Social history: Lives with: by herself Work history: [employer] in today for follow up of the following chronic medical issues:  1. Hyperlipidemia with target LDL less than 100 Does try to watch diet but does no exercise. Lab Results  Component Value Date   CHOL 171 06/23/2022   HDL 42 06/23/2022   LDLCALC 105 (H) 06/23/2022   TRIG 134 06/23/2022   CHOLHDL 4.1 06/23/2022     2. Obstructive sleep apnea syndrome Wear cpap nigtly  3. Recurrent major depressive disorder, in full remission ([place]) Is on lexapro and is doing well. Denies any medication side effects.    01/15/2023    4:06 PM 11/07/2022    9:19 AM 06/23/2022    8:52 AM  Depression screen PHQ 2/9  Decreased Interest 0 0 0  Down, Depressed, Hopeless 0 0 0  PHQ - 2 Score 0 0 0  Altered sleeping 0 0 0  Tired, decreased energy 0 1 3  Change in appetite 0 0 0  Feeling bad or failure about yourself  0 0 0  Trouble concentrating 0 0 0  Moving slowly or fidgety/restless 0 0 0  Suicidal thoughts 0 0 0  PHQ-9 Score 0 1 3  Difficult doing work/chores Not difficult at all Not difficult at all Somewhat difficult      4. BMI 33.0-33.9,adult No recent weight changes Wt Readings from Last 3 Encounters:  01/15/23 216 lb (98 kg)  11/07/22 215 lb 6 oz (97.7 kg)  08/26/22 212 lb (96.2 kg)   BMI Readings from Last 3 Encounters:  01/15/23 34.86 kg/m  11/07/22 34.76 kg/m  08/26/22 34.22 kg/m     New complaints: -Passing clots in stool. They are small clots- about sizes of "pinto bean" - no abdominal pain -low back pain daily- has t take tylenol. Rates pain 5/10. Says she sits a lot.  No Known Allergies Outpatient Encounter  Medications as of 01/15/2023  Medication Sig   atorvastatin (LIPITOR) 40 MG tablet Take 1 tablet (40 mg total) by mouth daily. Needs to be seen for further refills   celecoxib (CELEBREX) 200 MG capsule Take 1 capsule by mouth twice daily   cyclobenzaprine (FLEXERIL) 10 MG tablet Take 1 tablet (10 mg total) by mouth 3 (three) times daily as needed. for muscle spams   escitalopram (LEXAPRO) 20 MG tablet Take 1 tablet (20 mg total) by mouth daily.   terbinafine (LAMISIL) 250 MG tablet Take 250 mg by mouth daily.   Triamcinolone Acetonide (TRIAMCINOLONE 0.1 % CREAM : EUCERIN) CREA Apply 1 application topically 2 (two) times daily.   valACYclovir (VALTREX) 1000 MG tablet TAKE 1 TABLET BY MOUTH THREE TIMES DAILY   No facility-administered encounter medications on file as of 01/15/2023.    Past Surgical History:  Procedure Laterality Date   COLONOSCOPY  04-01-2004    Family History  Problem Relation Age of Onset   Ovarian cancer Mother    Breast cancer Neg Hx       Controlled substance contract: n/a     Review of Systems  Constitutional:  Negative for diaphoresis.  Eyes:  Negative for pain.  Respiratory:  Negative  for shortness of breath.   Cardiovascular:  Negative for chest pain, palpitations and leg swelling.  Gastrointestinal:  Negative for abdominal pain.  Endocrine: Negative for polydipsia.  Skin:  Negative for rash.  Neurological:  Negative for dizziness, weakness and headaches.  Hematological:  Does not bruise/bleed easily.  All other systems reviewed and are negative.      Objective:   Physical Exam Vitals and nursing note reviewed.  Constitutional:      General: She is not in acute distress.    Appearance: Normal appearance. She is well-developed.  HENT:     Head: Normocephalic.     Right Ear: Tympanic membrane normal.     Left Ear: Tympanic membrane normal.     Nose: Nose normal.     Mouth/Throat:     Mouth: Mucous membranes are moist.  Eyes:     Pupils:  Pupils are equal, round, and reactive to light.  Neck:     Vascular: No carotid bruit or JVD.  Cardiovascular:     Rate and Rhythm: Normal rate and regular rhythm.     Heart sounds: Normal heart sounds.  Pulmonary:     Effort: Pulmonary effort is normal. No respiratory distress.     Breath sounds: Normal breath sounds. No wheezing or rales.  Chest:     Chest wall: No tenderness.  Abdominal:     General: Bowel sounds are normal. There is no distension or abdominal bruit.     Palpations: Abdomen is soft. There is no hepatomegaly, splenomegaly, mass or pulsatile mass.     Tenderness: There is no abdominal tenderness.  Musculoskeletal:        General: Normal range of motion.     Cervical back: Normal range of motion and neck supple.     Comments: FROM of lumbar spine (-) SLR bil Motor strength and sensation distally intact  Lymphadenopathy:     Cervical: No cervical adenopathy.  Skin:    General: Skin is warm and dry.  Neurological:     Mental Status: She is alert and oriented to person, place, and time.     Deep Tendon Reflexes: Reflexes are normal and symmetric.  Psychiatric:        Behavior: Behavior normal.        Thought Content: Thought content normal.        Judgment: Judgment normal.    BP 118/69   Pulse 87   Temp 98.1 F (36.7 C) (Temporal)   Resp 20   Ht 5' 6"$  (1.676 m)   Wt 216 lb (98 kg)   BMI 34.86 kg/m         Assessment & Plan:   Mikayla Knight comes in today with chief complaint of Annual Exam   Diagnosis and orders addressed:  1. Hyperlipidemia with target LDL less than 100 Low fat diet - atorvastatin (LIPITOR) 40 MG tablet; Take 1 tablet (40 mg total) by mouth daily. Needs to be seen for further refills  Dispense: 90 tablet; Refill: 1 - CBC with Differential/Platelet - CMP14+EGFR - Lipid panel  2. Obstructive sleep apnea syndrome Continue to wear cpap machine  3. Recurrent major depressive disorder, in full remission ([place]) Stress  management - escitalopram (LEXAPRO) 20 MG tablet; Take 1 tablet (20 mg total) by mouth daily.  Dispense: 90 tablet; Refill: 1  4. BMI 33.0-33.9,adult Discussed diet and exercise for person with BMI >25 Will recheck weight in 3-6 months   5. Blood in stool Referral to gi - Ambulatory  referral to Gastroenterology  6. Gastroesophageal reflux disease without esophagitis Avoid spicy foods Do not eat 2 hours prior to bedtime  - omeprazole (PRILOSEC) 20 MG capsule; Take 1 capsule (20 mg total) by mouth daily.  Dispense: 90 capsule; Refill: 1  7. Acute midline low back pain with right-sided sciatica Moist heat'back exercises - predniSONE (STERAPRED UNI-PAK 21 TAB) 10 MG (21) TBPK tablet; As directed x 6 days  Dispense: 21 tablet; Refill: 0 - cyclobenzaprine (FLEXERIL) 10 MG tablet; Take 1 tablet (10 mg total) by mouth 3 (three) times daily as needed. for muscle spams  Dispense: 30 tablet; Refill: 1   Labs pending Health Maintenance reviewed Diet and exercise encouraged  Follow up plan: 6 months   [name], FNP

## 2023-01-16 LAB — LIPID PANEL
Chol/HDL Ratio: 3.8 ratio (ref 0.0–4.4)
Cholesterol, Total: 177 mg/dL (ref 100–199)
HDL: 46 mg/dL (ref >39)
LDL Chol Calc (NIH): 89 mg/dL (ref 0–99)
Triglycerides: 250 mg/dL — ABNORMAL HIGH (ref 0–149)
VLDL Cholesterol Cal: 42 mg/dL — ABNORMAL HIGH (ref 5–40)

## 2023-01-16 LAB — CMP14+EGFR
ALT: 23 IU/L (ref 0–32)
AST: 19 IU/L (ref 0–40)
Albumin/Globulin Ratio: 1.6 (ref 1.2–2.2)
Albumin: 4.4 g/dL (ref 3.9–4.9)
Alkaline Phosphatase: 108 IU/L (ref 44–121)
BUN/Creatinine Ratio: 16 (ref 12–28)
BUN: 15 mg/dL (ref 8–27)
Bilirubin Total: <0.2 mg/dL (ref 0.0–1.2)
CO2: 24 mmol/L (ref 20–29)
Calcium: 9.9 mg/dL (ref 8.7–10.3)
Chloride: 102 mmol/L (ref 96–106)
Creatinine, Ser: 0.95 mg/dL (ref 0.57–1.00)
Globulin, Total: 2.8 g/dL (ref 1.5–4.5)
Glucose: 90 mg/dL (ref 70–99)
Potassium: 4.6 mmol/L (ref 3.5–5.2)
Sodium: 140 mmol/L (ref 134–144)
Total Protein: 7.2 g/dL (ref 6.0–8.5)
eGFR: 64 mL/min/{1.73_m2} (ref >59)

## 2023-01-16 LAB — CBC WITH DIFFERENTIAL/PLATELET
Basophils Absolute: 0.1 10*3/uL (ref 0.0–0.2)
Basos: 1 %
EOS (ABSOLUTE): 0.3 10*3/uL (ref 0.0–0.4)
Eos: 3 %
Hematocrit: 40.1 % (ref 34.0–46.6)
Hemoglobin: 13.3 g/dL (ref 11.1–15.9)
Immature Grans (Abs): 0 10*3/uL (ref 0.0–0.1)
Immature Granulocytes: 0 %
Lymphocytes Absolute: 2.4 10*3/uL (ref 0.7–3.1)
Lymphs: 28 %
MCH: 32.3 pg (ref 26.6–33.0)
MCHC: 33.2 g/dL (ref 31.5–35.7)
MCV: 97 fL (ref 79–97)
Monocytes Absolute: 0.5 10*3/uL (ref 0.1–0.9)
Monocytes: 6 %
Neutrophils Absolute: 5.4 10*3/uL (ref 1.4–7.0)
Neutrophils: 62 %
Platelets: 242 10*3/uL (ref 150–450)
RBC: 4.12 x10E6/uL (ref 3.77–5.28)
RDW: 13.1 % (ref 11.7–15.4)
WBC: 8.5 10*3/uL (ref 3.4–10.8)

## 2023-01-18 DIAGNOSIS — G4733 Obstructive sleep apnea (adult) (pediatric): Secondary | ICD-10-CM | POA: Diagnosis not present

## 2023-02-11 DIAGNOSIS — G4733 Obstructive sleep apnea (adult) (pediatric): Secondary | ICD-10-CM | POA: Diagnosis not present

## 2023-02-16 DIAGNOSIS — G4733 Obstructive sleep apnea (adult) (pediatric): Secondary | ICD-10-CM | POA: Diagnosis not present

## 2023-03-02 ENCOUNTER — Ambulatory Visit: Payer: BC Managed Care – PPO | Admitting: Nurse Practitioner

## 2023-03-02 ENCOUNTER — Encounter: Payer: Self-pay | Admitting: Nurse Practitioner

## 2023-03-02 VITALS — BP 124/70 | HR 80 | Temp 98.0°F | Resp 20 | Ht 66.0 in | Wt 212.0 lb

## 2023-03-02 DIAGNOSIS — M545 Low back pain, unspecified: Secondary | ICD-10-CM | POA: Diagnosis not present

## 2023-03-02 DIAGNOSIS — R1011 Right upper quadrant pain: Secondary | ICD-10-CM | POA: Diagnosis not present

## 2023-03-02 MED ORDER — METHYLPREDNISOLONE ACETATE 80 MG/ML IJ SUSP
80.0000 mg | Freq: Once | INTRAMUSCULAR | Status: AC
  Administered 2023-03-02: 80 mg via INTRAMUSCULAR

## 2023-03-02 MED ORDER — KETOROLAC TROMETHAMINE 60 MG/2ML IM SOLN
60.0000 mg | Freq: Once | INTRAMUSCULAR | Status: AC
  Administered 2023-03-02: 60 mg via INTRAMUSCULAR

## 2023-03-02 NOTE — Progress Notes (Signed)
   Subjective:    Patient ID: Mikayla Knight, female    DOB: [birth year], 72 y.o.   MRN: [id number]  Chief Complaint: Back Pain and Abdominal Pain   Back Pain Associated symptoms include abdominal pain. Pertinent negatives include no chest pain, headaches or weakness.  Abdominal Pain Pertinent negatives include no headaches.    Patient Active Problem List   Diagnosis Date Noted   Gastroesophageal reflux disease without esophagitis 01/15/2023   Obstructive sleep apnea syndrome 06/23/2022   HSV-1 (herpes simplex virus 1) infection 04/09/2018   BMI 33.0-33.9,adult 03/05/2017   Depression 09/29/2013   Hyperlipidemia with target LDL less than 100 09/29/2013   Patient come sin c/o; - back pain that started several months ago seems to radiate around into groin on right. Rates pain 6-7/10. Nothing make it better. Walking and getting up out of a chair increases pain. She has been taking ibuprofen and steroids. That has not helped. - lower abdominal pain/epigastric- started at 4am Sunday. Has had some urgency and frequency. She used heating pad and it seemed  to get better. Not hurting today.    Review of Systems  Constitutional:  Negative for diaphoresis.  Eyes:  Negative for pain.  Respiratory:  Negative for shortness of breath.   Cardiovascular:  Negative for chest pain, palpitations and leg swelling.  Gastrointestinal:  Positive for abdominal pain.  Endocrine: Negative for polydipsia.  Musculoskeletal:  Positive for back pain.  Skin:  Negative for rash.  Neurological:  Negative for dizziness, weakness and headaches.  Hematological:  Does not bruise/bleed easily.  All other systems reviewed and are negative.      Objective:   Physical Exam Constitutional:      Appearance: She is well-developed.  Cardiovascular:     Rate and Rhythm: Normal rate and regular rhythm.     Heart sounds: Normal heart sounds.  Pulmonary:     Breath sounds: Normal breath sounds.  Abdominal:      Tenderness: There is abdominal tenderness (RUQ).  Musculoskeletal:     Comments: FROM of lumbar spine with pain on flexion and extension (-) SLR Motor strength and sensation distally intact  Skin:    General: Skin is warm.  Neurological:     General: No focal deficit present.     Mental Status: She is alert and oriented to person, place, and time.  Psychiatric:        Behavior: Behavior normal.    BP 124/70   Pulse 80   Temp 98 F (36.7 C) (Temporal)   Resp 20   Ht 5\' 6"  (1.676 m)   Wt 212 lb (96.2 kg)   SpO2 97%   BMI 34.22 kg/m         Assessment & Plan:  [name] comes in today with chief complaint of Back Pain and Abdominal Pain   Diagnosis and orders addressed:  1. Acute midline low back pain without sciatica Moist heat Rest No bending or stooping - MR Lumbar Spine Wo Contrast; Future - methylPREDNISolone acetate (DEPO-MEDROL) injection 80 mg - ketorolac (TORADOL) injection 60 mg  2. RUQ pain Avoid spicy and fatty foods - US Abdomen Limited RUQ (LIVER/GB); Future   Labs pending Health Maintenance reviewed Diet and exercise encouraged  Follow up plan: prn   Mary-Margaret Daphine Deutscher, FNP

## 2023-03-02 NOTE — Patient Instructions (Signed)
Metamucil daily and Senna S twice a day.    Take Mag Citrate as needed.    Cholecystitis Cholecystitis is irritation and swelling (inflammation) of the gallbladder. The gallbladder: Is an organ that is shaped like a pear. Is under the liver on the right side of the body. Stores bile. Bile helps the body break down (digest) the fats in food. This condition can occur all of a sudden. It needs to be treated. What are the causes? This condition may be caused by stones or lumps that form in the gallbladder (gallstones). Gallstones can block the tube (duct) that carries bile out of your gallbladder. Other causes include: Damage to the gallbladder due to less blood flow. Germs in the bile ducts. Scars, kinks, or adhesions in the bile ducts. Abnormal growths (tumors) in the liver, pancreas, or gallbladder. What increases the risk? You are more likely to develop this condition if: You are female and between the ages of 55-62. You take birth control pills. You use estrogen. You take certain medicines that make you more likely to develop gallstones. You are overweight (obese). You have a very bad reaction to an infection (sepsis). You have been hospitalized due to a serious condition, such as a burn or illness. You have not eaten or drank for a long time. What are the signs or symptoms? Symptoms of this condition include: Pain in the upper right part of the belly (abdomen). A lump over the gallbladder. Bloating in the belly. Feeling sick to your stomach (nauseous). Vomiting. Fever. Chills. How is this treated? This condition may be treated with: Medicines to treat pain. Giving fluids through an IV tube. Not eating or drinking (fasting). Antibiotic medicines. Surgery to take out your gallbladder. Gallbladder drainage. Follow these instructions at home: Medicines  Take over-the-counter and prescription medicines only as told by your doctor. If you were prescribed an antibiotic  medicine, take it as told by your doctor. Do not stop taking it even if you start to feel better. General instructions Follow instructions from your doctor about what to eat or drink. Do not eat or drink anything that makes you sick again. Do not smoke or use any products that contain nicotine or tobacco. If you need help quitting, ask your doctor. Keep all follow-up visits. Contact a doctor if: You have pain and your medicine does not help. You have a fever. Get help right away if: Your pain moves to: Another part of your belly. Your back. Your symptoms do not go away. You have new symptoms. These symptoms may be an emergency. Get help right away. Call 911. Do not wait to see if the symptoms will go away. Do not drive yourself to the hospital. Summary This condition may be caused by stones or lumps that form in the gallbladder (gallstones). A common symptom is pain in your belly. This condition may be treated with surgery to take out your gallbladder. Follow instructions from your doctor about what to eat or drink. This information is not intended to replace advice given to you by your health care provider. Make sure you discuss any questions you have with your health care provider. Document Revised: 05/14/2021 Document Reviewed: 05/14/2021 Elsevier Patient Education  2023 [employer]  

## 2023-03-14 DIAGNOSIS — G4733 Obstructive sleep apnea (adult) (pediatric): Secondary | ICD-10-CM | POA: Diagnosis not present

## 2023-03-19 DIAGNOSIS — G4733 Obstructive sleep apnea (adult) (pediatric): Secondary | ICD-10-CM | POA: Diagnosis not present

## 2023-03-26 ENCOUNTER — Other Ambulatory Visit: Payer: Self-pay | Admitting: Nurse Practitioner

## 2023-03-26 DIAGNOSIS — B009 Herpesviral infection, unspecified: Secondary | ICD-10-CM

## 2023-03-30 ENCOUNTER — Other Ambulatory Visit: Payer: Self-pay | Admitting: Nurse Practitioner

## 2023-03-30 DIAGNOSIS — R1011 Right upper quadrant pain: Secondary | ICD-10-CM

## 2023-03-31 ENCOUNTER — Encounter: Payer: Self-pay | Admitting: Nurse Practitioner

## 2023-04-01 ENCOUNTER — Telehealth: Payer: Self-pay | Admitting: Nurse Practitioner

## 2023-04-01 NOTE — Telephone Encounter (Signed)
Spoke with Byrd Hesselbach and she is going to schedule her for the complete.

## 2023-04-01 NOTE — Telephone Encounter (Signed)
[facility] calling for clarification on ultrasound for abdomen. Please call back and clarify. Has appt scheduled for Friday 5/10

## 2023-04-03 ENCOUNTER — Ambulatory Visit
Admission: RE | Admit: 2023-04-03 | Discharge: 2023-04-03 | Disposition: A | Discharge status: Home / Self Care | Payer: BC Managed Care – PPO | Source: Ambulatory Visit | Attending: Nurse Practitioner | Admitting: Nurse Practitioner

## 2023-04-03 ENCOUNTER — Other Ambulatory Visit: Payer: BC Managed Care – PPO

## 2023-04-03 DIAGNOSIS — R1011 Right upper quadrant pain: Secondary | ICD-10-CM

## 2023-04-03 DIAGNOSIS — R109 Unspecified abdominal pain: Secondary | ICD-10-CM | POA: Diagnosis not present

## 2023-04-03 DIAGNOSIS — M5430 Sciatica, unspecified side: Secondary | ICD-10-CM | POA: Diagnosis not present

## 2023-04-03 DIAGNOSIS — M4316 Spondylolisthesis, lumbar region: Secondary | ICD-10-CM | POA: Diagnosis not present

## 2023-04-03 DIAGNOSIS — M545 Low back pain, unspecified: Secondary | ICD-10-CM

## 2023-04-10 ENCOUNTER — Other Ambulatory Visit: Payer: Self-pay | Admitting: Nurse Practitioner

## 2023-04-10 DIAGNOSIS — M545 Low back pain, unspecified: Secondary | ICD-10-CM

## 2023-04-13 DIAGNOSIS — G4733 Obstructive sleep apnea (adult) (pediatric): Secondary | ICD-10-CM | POA: Diagnosis not present

## 2023-04-18 DIAGNOSIS — G4733 Obstructive sleep apnea (adult) (pediatric): Secondary | ICD-10-CM | POA: Diagnosis not present

## 2023-04-27 ENCOUNTER — Encounter: Payer: Self-pay | Admitting: Gastroenterology

## 2023-05-05 MED ORDER — AZITHROMYCIN 250 MG PO TABS: ORAL_TABLET | ORAL | 0 refills | Status: DC

## 2023-05-18 ENCOUNTER — Encounter: Payer: Self-pay | Admitting: *Deleted

## 2023-05-19 DIAGNOSIS — Z6834 Body mass index (BMI) 34.0-34.9, adult: Secondary | ICD-10-CM | POA: Diagnosis not present

## 2023-05-19 DIAGNOSIS — M5126 Other intervertebral disc displacement, lumbar region: Secondary | ICD-10-CM | POA: Diagnosis not present

## 2023-06-09 DIAGNOSIS — M48062 Spinal stenosis, lumbar region with neurogenic claudication: Secondary | ICD-10-CM | POA: Diagnosis not present

## 2023-07-06 ENCOUNTER — Ambulatory Visit: Payer: BC Managed Care – PPO | Admitting: Nurse Practitioner

## 2023-07-13 ENCOUNTER — Ambulatory Visit: Payer: BC Managed Care – PPO | Admitting: Nurse Practitioner

## 2023-07-13 ENCOUNTER — Encounter: Payer: Self-pay | Admitting: Nurse Practitioner

## 2023-07-13 ENCOUNTER — Other Ambulatory Visit: Payer: Self-pay

## 2023-07-13 VITALS — BP 134/77 | HR 86 | Temp 97.7°F | Resp 20 | Ht 66.0 in | Wt 209.0 lb

## 2023-07-13 DIAGNOSIS — K219 Gastro-esophageal reflux disease without esophagitis: Secondary | ICD-10-CM

## 2023-07-13 DIAGNOSIS — L2084 Intrinsic (allergic) eczema: Secondary | ICD-10-CM

## 2023-07-13 DIAGNOSIS — E785 Hyperlipidemia, unspecified: Secondary | ICD-10-CM

## 2023-07-13 DIAGNOSIS — R8290 Unspecified abnormal findings in urine: Secondary | ICD-10-CM | POA: Diagnosis not present

## 2023-07-13 DIAGNOSIS — G4733 Obstructive sleep apnea (adult) (pediatric): Secondary | ICD-10-CM

## 2023-07-13 DIAGNOSIS — B009 Herpesviral infection, unspecified: Secondary | ICD-10-CM

## 2023-07-13 DIAGNOSIS — F3342 Major depressive disorder, recurrent, in full remission: Secondary | ICD-10-CM | POA: Diagnosis not present

## 2023-07-13 DIAGNOSIS — Z6833 Body mass index (BMI) 33.0-33.9, adult: Secondary | ICD-10-CM

## 2023-07-13 MED ORDER — OMEPRAZOLE 20 MG PO CPDR: 20.0000 mg | DELAYED_RELEASE_CAPSULE | Freq: Every day | ORAL | 1 refills | Status: DC

## 2023-07-13 MED ORDER — ESCITALOPRAM OXALATE 20 MG PO TABS: 20.0000 mg | ORAL_TABLET | Freq: Every day | ORAL | 1 refills | Status: DC

## 2023-07-13 MED ORDER — TRIAMCINOLONE 0.1 % CREAM:EUCERIN CREAM 1:1: 1.0000 | TOPICAL_CREAM | Freq: Two times a day (BID) | CUTANEOUS | 2 refills | Status: AC

## 2023-07-13 MED ORDER — ATORVASTATIN CALCIUM 40 MG PO TABS: 40.0000 mg | ORAL_TABLET | Freq: Every day | ORAL | 1 refills | Status: DC

## 2023-07-13 MED ORDER — VALACYCLOVIR HCL 1 G PO TABS: 1000.0000 mg | ORAL_TABLET | Freq: Three times a day (TID) | ORAL | 1 refills | Status: DC

## 2023-07-13 NOTE — Patient Instructions (Signed)
Acute Back Pain, Adult Acute back pain is sudden and usually short-lived. It is often caused by an injury to the muscles and tissues in the back. The injury may result from: A muscle, tendon, or ligament getting overstretched or torn. Ligaments are tissues that connect bones to each other. Lifting something improperly can cause a back strain. Wear and tear (degeneration) of the spinal disks. Spinal disks are circular tissue that provide cushioning between the bones of the spine (vertebrae). Twisting motions, such as while playing sports or doing yard work. A hit to the back. Arthritis. You may have a physical exam, lab tests, and imaging tests to find the cause of your pain. Acute back pain usually goes away with rest and home care. Follow these instructions at home: Managing pain, stiffness, and swelling Take over-the-counter and prescription medicines only as told by your health care provider. Treatment may include medicines for pain and inflammation that are taken by mouth or applied to the skin, or muscle relaxants. Your health care provider may recommend applying ice during the first 24-48 hours after your pain starts. To do this: Put ice in a plastic bag. Place a towel between your skin and the bag. Leave the ice on for 20 minutes, 2-3 times a day. Remove the ice if your skin turns bright red. This is very important. If you cannot feel pain, heat, or cold, you have a greater risk of damage to the area. If directed, apply heat to the affected area as often as told by your health care provider. Use the heat source that your health care provider recommends, such as a moist heat pack or a heating pad. Place a towel between your skin and the heat source. Leave the heat on for 20-30 minutes. Remove the heat if your skin turns bright red. This is especially important if you are unable to feel pain, heat, or cold. You have a greater risk of getting burned. Activity  Do not stay in bed. Staying in  bed for more than 1-2 days can delay your recovery. Sit up and stand up straight. Avoid leaning forward when you sit or hunching over when you stand. If you work at a desk, sit close to it so you do not need to lean over. Keep your chin tucked in. Keep your neck drawn back, and keep your elbows bent at a 90-degree angle (right angle). Sit high and close to the steering wheel when you drive. Add lower back (lumbar) support to your car seat, if needed. Take short walks on even surfaces as soon as you are able. Try to increase the length of time you walk each day. Do not sit, drive, or stand in one place for more than 30 minutes at a time. Sitting or standing for long periods of time can put stress on your back. Do not drive or use heavy machinery while taking prescription pain medicine. Use proper lifting techniques. When you bend and lift, use positions that put less stress on your back: Bend your knees. Keep the load close to your body. Avoid twisting. Exercise regularly as told by your health care provider. Exercising helps your back heal faster and helps prevent back injuries by keeping muscles strong and flexible. Work with a physical therapist to make a safe exercise program, as recommended by your health care provider. Do any exercises as told by your physical therapist. Lifestyle Maintain a healthy weight. Extra weight puts stress on your back and makes it difficult to have good   posture. Avoid activities or situations that make you feel anxious or stressed. Stress and anxiety increase muscle tension and can make back pain worse. Learn ways to manage anxiety and stress, such as through exercise. General instructions Sleep on a firm mattress in a comfortable position. Try lying on your side with your knees slightly bent. If you lie on your back, put a pillow under your knees. Keep your head and neck in a straight line with your spine (neutral position) when using electronic equipment like  smartphones or pads. To do this: Raise your smartphone or pad to look at it instead of bending your head or neck to look down. Put the smartphone or pad at the level of your face while looking at the screen. Follow your treatment plan as told by your health care provider. This may include: Cognitive or behavioral therapy. Acupuncture or massage therapy. Meditation or yoga. Contact a health care provider if: You have pain that is not relieved with rest or medicine. You have increasing pain going down into your legs or buttocks. Your pain does not improve after 2 weeks. You have pain at night. You lose weight without trying. You have a fever or chills. You develop nausea or vomiting. You develop abdominal pain. Get help right away if: You develop new bowel or bladder control problems. You have unusual weakness or numbness in your arms or legs. You feel faint. These symptoms may represent a serious problem that is an emergency. Do not wait to see if the symptoms will go away. Get medical help right away. Call your local emergency services (911 in the [place] Do not drive yourself to the hospital. Summary Acute back pain is sudden and usually short-lived. Use proper lifting techniques. When you bend and lift, use positions that put less stress on your back. Take over-the-counter and prescription medicines only as told by your health care provider, and apply heat or ice as told. This information is not intended to replace advice given to you by your health care provider. Make sure you discuss any questions you have with your health care provider. Document Revised: 02/01/2021 Document Reviewed: 02/01/2021 Elsevier Patient Education  2024 Elsevier Inc.  

## 2023-07-13 NOTE — Addendum Note (Signed)
Addended by: Bennie Pierini on: 07/13/2023 04:26 PM   Modules accepted: Orders

## 2023-07-13 NOTE — Progress Notes (Signed)
Subjective:    Patient ID: Mikayla Knight, female    DOB: October 29, 1951, 72 y.o.   MRN: [id number]   Chief Complaint: medical management of chronic issues     HPI:  Mikayla Knight is a 72 y.o. who identifies as a female who was assigned female at birth.   Social history: Lives with: by herself Work history: works in [employer] in today for follow up of the following chronic medical issues:  1. Hyperlipidemia with target LDL less than 100 Does not watch diet and does very little exercise. Lab Results  Component Value Date   CHOL 177 01/15/2023   HDL 46 01/15/2023   LDLCALC 89 01/15/2023   TRIG 250 (H) 01/15/2023   CHOLHDL 3.8 01/15/2023     2. Recurrent major depressive disorder, in full remission (HCC) Has been on lexapro for several years. Is doing ok.    07/13/2023    4:07 PM 03/02/2023    3:35 PM 01/15/2023    4:06 PM  Depression screen PHQ 2/9  Decreased Interest 0 0 0  Down, Depressed, Hopeless 0 0 0  PHQ - 2 Score 0 0 0  Altered sleeping 0 0 0  Tired, decreased energy 0 1 0  Change in appetite 0 0 0  Feeling bad or failure about yourself  0 0 0  Trouble concentrating 0 0 0  Moving slowly or fidgety/restless 0 0 0  Suicidal thoughts 0 0 0  PHQ-9 Score 0 1 0  Difficult doing work/chores Not difficult at all Not difficult at all Not difficult at all     3. Obstructive sleep apnea syndrome Wears cpap nightly  4. Gastroesophageal reflux disease without esophagitis Is on omeprazole daily and is dong well.  5. BMI 33.0-33.9,adult No recent weight changes Wt Readings from Last 3 Encounters:  07/13/23 209 lb (94.8 kg)  03/02/23 212 lb (96.2 kg)  01/15/23 216 lb (98 kg)   BMI Readings from Last 3 Encounters:  07/13/23 33.73 kg/m  03/02/23 34.22 kg/m  01/15/23 34.86 kg/m     New complaints: None today  No Known Allergies Outpatient Encounter Medications as of 07/13/2023  Medication Sig   atorvastatin (LIPITOR) 40 MG tablet Take 1 tablet  (40 mg total) by mouth daily. Needs to be seen for further refills   azithromycin (ZITHROMAX Z-PAK) 250 MG tablet As directed   celecoxib (CELEBREX) 200 MG capsule Take 1 capsule by mouth twice daily   Cyanocobalamin (B-12 PO) Take by mouth.   cyclobenzaprine (FLEXERIL) 10 MG tablet Take 1 tablet (10 mg total) by mouth 3 (three) times daily as needed. for muscle spams   Docusate Calcium (STOOL SOFTENER PO) Take by mouth.   escitalopram (LEXAPRO) 20 MG tablet Take 1 tablet (20 mg total) by mouth daily.   glucosamine-chondroitin 500-400 MG tablet Take 1 tablet by mouth 3 (three) times daily.   loratadine (CLARITIN) 10 MG tablet Take 10 mg by mouth daily.   MAGNESIUM PO Take by mouth.   Multiple Vitamin (MULTIVITAMIN PO) Take by mouth.   Omega-3 Fatty Acids (FISH OIL PO) Take by mouth.   omeprazole (PRILOSEC) 20 MG capsule Take 1 capsule (20 mg total) by mouth daily.   terbinafine (LAMISIL) 250 MG tablet Take 250 mg by mouth daily.   Triamcinolone Acetonide (TRIAMCINOLONE 0.1 % CREAM : EUCERIN) CREA Apply 1 application topically 2 (two) times daily.   valACYclovir (VALTREX) 1000 MG tablet TAKE 1 TABLET BY MOUTH THREE TIMES DAILY   VITAMIN  D PO Take by mouth.   VITAMIN E PO Take by mouth.   No facility-administered encounter medications on file as of 07/13/2023.    Past Surgical History:  Procedure Laterality Date   COLONOSCOPY  04-01-2004    Family History  Problem Relation Age of Onset   Ovarian cancer Mother    Breast cancer Neg Hx       Controlled substance contract: n/a     Review of Systems  Constitutional:  Negative for diaphoresis.  Eyes:  Negative for pain.  Respiratory:  Negative for shortness of breath.   Cardiovascular:  Negative for chest pain, palpitations and leg swelling.  Gastrointestinal:  Negative for abdominal pain.  Endocrine: Negative for polydipsia.  Skin:  Negative for rash.  Neurological:  Negative for dizziness, weakness and headaches.   Hematological:  Does not bruise/bleed easily.  All other systems reviewed and are negative.      Objective:   Physical Exam Vitals and nursing note reviewed.  Constitutional:      General: She is not in acute distress.    Appearance: Normal appearance. She is well-developed.  HENT:     Head: Normocephalic.     Right Ear: Tympanic membrane normal.     Left Ear: Tympanic membrane normal.     Nose: Nose normal.     Mouth/Throat:     Mouth: Mucous membranes are moist.  Eyes:     Pupils: Pupils are equal, round, and reactive to light.  Neck:     Vascular: No carotid bruit or JVD.  Cardiovascular:     Rate and Rhythm: Normal rate and regular rhythm.     Heart sounds: Normal heart sounds.  Pulmonary:     Effort: Pulmonary effort is normal. No respiratory distress.     Breath sounds: Normal breath sounds. No wheezing or rales.  Chest:     Chest wall: No tenderness.  Abdominal:     General: Bowel sounds are normal. There is no distension or abdominal bruit.     Palpations: Abdomen is soft. There is no hepatomegaly, splenomegaly, mass or pulsatile mass.     Tenderness: There is no abdominal tenderness.  Musculoskeletal:        General: Normal range of motion.     Cervical back: Normal range of motion and neck supple.  Lymphadenopathy:     Cervical: No cervical adenopathy.  Skin:    General: Skin is warm and dry.  Neurological:     Mental Status: She is alert and oriented to person, place, and time.     Deep Tendon Reflexes: Reflexes are normal and symmetric.  Psychiatric:        Behavior: Behavior normal.        Thought Content: Thought content normal.        Judgment: Judgment normal.    BP 134/77   Pulse 86   Temp 97.7 F (36.5 C) (Temporal)   Resp 20   Ht 5\' 6"  (1.676 m)   Wt 209 lb (94.8 kg)   SpO2 97%   BMI 33.73 kg/m         Assessment & Plan:  Mikayla Knight comes in today with chief complaint of Medical Management of Chronic Issues   Diagnosis and  orders addressed:  1. Hyperlipidemia with target LDL less than 100 Low fat diet  2. Recurrent major depressive disorder, in full remission (HCC) Stress management - escitalopram (LEXAPRO) 20 MG tablet; Take 1 tablet (20 mg total) by mouth daily.  Dispense: 90 tablet; Refill: 1  3. Obstructive sleep apnea syndrome Wear cpap nightly  4. Gastroesophageal reflux disease without esophagitis Avoid spicy foods Do not eat 2 hours prior to bedtime - omeprazole (PRILOSEC) 20 MG capsule; Take 1 capsule (20 mg total) by mouth daily.  Dispense: 90 capsule; Refill: 1  5. BMI 33.0-33.9,adult Discussed diet and exercise for person with BMI >25 Will recheck weight in 3-6 months   6. Intrinsic eczema - Triamcinolone Acetonide (TRIAMCINOLONE 0.1 % CREAM : EUCERIN) CREA; Apply 1 Application topically 2 (two) times daily.  Dispense: 1 each; Refill: 2  7. HSV-1 (herpes simplex virus 1) infection - valACYclovir (VALTREX) 1000 MG tablet; Take 1 tablet (1,000 mg total) by mouth 3 (three) times daily.  Dispense: 90 tablet; Refill: 1   Labs pending Health Maintenance reviewed Diet and exercise encouraged  Follow up plan: 6 months   Mary-Margaret Daphine Deutscher, FNP

## 2023-07-14 LAB — LIPID PANEL
Chol/HDL Ratio: 3.5 ratio (ref 0.0–4.4)
Cholesterol, Total: 187 mg/dL (ref 100–199)
HDL: 54 mg/dL (ref >39)
LDL Chol Calc (NIH): 103 mg/dL — ABNORMAL HIGH (ref 0–99)
Triglycerides: 171 mg/dL — ABNORMAL HIGH (ref 0–149)
VLDL Cholesterol Cal: 30 mg/dL (ref 5–40)

## 2023-07-14 LAB — URINALYSIS, COMPLETE
Bilirubin, UA: NEGATIVE
Glucose, UA: NEGATIVE
Ketones, UA: NEGATIVE
Nitrite, UA: NEGATIVE
Protein,UA: NEGATIVE
RBC, UA: NEGATIVE
Specific Gravity, UA: 1.01 (ref 1.005–1.030)
Urobilinogen, Ur: 0.2 mg/dL (ref 0.2–1.0)
pH, UA: 7 (ref 5.0–7.5)

## 2023-07-14 LAB — CMP14+EGFR
ALT: 21 IU/L (ref 0–32)
AST: 20 IU/L (ref 0–40)
Albumin: 4.4 g/dL (ref 3.8–4.8)
Alkaline Phosphatase: 94 IU/L (ref 44–121)
BUN/Creatinine Ratio: 18 (ref 12–28)
BUN: 13 mg/dL (ref 8–27)
Bilirubin Total: 0.2 mg/dL (ref 0.0–1.2)
CO2: 26 mmol/L (ref 20–29)
Calcium: 9.2 mg/dL (ref 8.7–10.3)
Chloride: 101 mmol/L (ref 96–106)
Creatinine, Ser: 0.73 mg/dL (ref 0.57–1.00)
Globulin, Total: 2.5 g/dL (ref 1.5–4.5)
Glucose: 84 mg/dL (ref 70–99)
Potassium: 4.4 mmol/L (ref 3.5–5.2)
Sodium: 141 mmol/L (ref 134–144)
Total Protein: 6.9 g/dL (ref 6.0–8.5)
eGFR: 88 mL/min/{1.73_m2} (ref >59)

## 2023-07-14 LAB — CBC WITH DIFFERENTIAL/PLATELET
Basophils Absolute: 0 10*3/uL (ref 0.0–0.2)
Basos: 0 %
EOS (ABSOLUTE): 0.1 10*3/uL (ref 0.0–0.4)
Eos: 1 %
Hematocrit: 41 % (ref 34.0–46.6)
Hemoglobin: 13.7 g/dL (ref 11.1–15.9)
Immature Grans (Abs): 0 10*3/uL (ref 0.0–0.1)
Immature Granulocytes: 0 %
Lymphocytes Absolute: 2.6 10*3/uL (ref 0.7–3.1)
Lymphs: 30 %
MCH: 32.9 pg (ref 26.6–33.0)
MCHC: 33.4 g/dL (ref 31.5–35.7)
MCV: 99 fL — ABNORMAL HIGH (ref 79–97)
Monocytes Absolute: 0.5 10*3/uL (ref 0.1–0.9)
Monocytes: 5 %
Neutrophils Absolute: 5.3 10*3/uL (ref 1.4–7.0)
Neutrophils: 64 %
Platelets: 261 10*3/uL (ref 150–450)
RBC: 4.16 x10E6/uL (ref 3.77–5.28)
RDW: 12.5 % (ref 11.7–15.4)
WBC: 8.5 10*3/uL (ref 3.4–10.8)

## 2023-07-29 DIAGNOSIS — M48062 Spinal stenosis, lumbar region with neurogenic claudication: Secondary | ICD-10-CM | POA: Diagnosis not present

## 2023-07-29 DIAGNOSIS — Z6834 Body mass index (BMI) 34.0-34.9, adult: Secondary | ICD-10-CM | POA: Diagnosis not present

## 2023-07-29 DIAGNOSIS — M5126 Other intervertebral disc displacement, lumbar region: Secondary | ICD-10-CM | POA: Diagnosis not present

## 2023-08-24 DIAGNOSIS — M5459 Other low back pain: Secondary | ICD-10-CM | POA: Diagnosis not present

## 2023-08-24 DIAGNOSIS — M48062 Spinal stenosis, lumbar region with neurogenic claudication: Secondary | ICD-10-CM | POA: Diagnosis not present

## 2023-09-01 DIAGNOSIS — M5416 Radiculopathy, lumbar region: Secondary | ICD-10-CM | POA: Diagnosis not present

## 2023-09-01 DIAGNOSIS — M5116 Intervertebral disc disorders with radiculopathy, lumbar region: Secondary | ICD-10-CM | POA: Diagnosis not present

## 2023-10-19 ENCOUNTER — Ambulatory Visit: Payer: BC Managed Care – PPO | Admitting: Nurse Practitioner

## 2023-10-19 ENCOUNTER — Encounter: Payer: Self-pay | Admitting: Nurse Practitioner

## 2023-10-19 VITALS — BP 128/72 | HR 81 | Temp 96.9°F | Resp 20 | Ht 66.0 in | Wt 210.0 lb

## 2023-10-19 DIAGNOSIS — R300 Dysuria: Secondary | ICD-10-CM

## 2023-10-19 LAB — MICROSCOPIC EXAMINATION
RBC, Urine: NONE SEEN /[HPF] (ref 0–2)
Renal Epithel, UA: NONE SEEN /[HPF]
Yeast, UA: NONE SEEN

## 2023-10-19 LAB — URINALYSIS, COMPLETE
Bilirubin, UA: NEGATIVE
Glucose, UA: NEGATIVE
Ketones, UA: NEGATIVE
Nitrite, UA: NEGATIVE
Protein,UA: NEGATIVE
RBC, UA: NEGATIVE
Specific Gravity, UA: 1.015 (ref 1.005–1.030)
Urobilinogen, Ur: 0.2 mg/dL (ref 0.2–1.0)
pH, UA: 6 (ref 5.0–7.5)

## 2023-10-19 NOTE — Progress Notes (Deleted)
   Subjective:    Patient ID: Mikayla Knight, female    DOB: 1951-04-15, 72 y.o.   MRN: [id number]   Chief Complaint: medical management of chronic issues     HPI:  Mikayla Knight is a 72 y.o. who identifies as a female who was assigned female at birth.   Social history: Lives with: by herself- family and friends check on her dialy Work history: works in [employer] in today for follow up of the following chronic medical issues:  1. Hyperlipidemia with target LDL less than 100 Does try to watch diet but does no dedicated exercise. Lab Results  Component Value Date   CHOL 187 07/13/2023   HDL 54 07/13/2023   LDLCALC 103 (H) 07/13/2023   TRIG 171 (H) 07/13/2023   CHOLHDL 3.5 07/13/2023     2. Recurrent major depressive disorder, in full remission (HCC) Is on lexapor and is doing well. ***  3. Gastroesophageal reflux disease without esophagitis Takes omeprazole daily , working well for her  4. Obstructive sleep apnea syndrome Wears cpap nightly  5. BMI 33.0-33.9,adult . No recent weiht changes ***   New complaints: ***  No Known Allergies Outpatient Encounter Medications as of 10/19/2023  Medication Sig   atorvastatin (LIPITOR) 40 MG tablet Take 1 tablet (40 mg total) by mouth daily. Needs to be seen for further refills   celecoxib (CELEBREX) 200 MG capsule Take 1 capsule by mouth twice daily   Cyanocobalamin (B-12 PO) Take by mouth.   cyclobenzaprine (FLEXERIL) 10 MG tablet Take 1 tablet (10 mg total) by mouth 3 (three) times daily as needed. for muscle spams   Docusate Calcium (STOOL SOFTENER PO) Take by mouth.   escitalopram (LEXAPRO) 20 MG tablet Take 1 tablet (20 mg total) by mouth daily.   glucosamine-chondroitin 500-400 MG tablet Take 1 tablet by mouth 3 (three) times daily.   loratadine (CLARITIN) 10 MG tablet Take 10 mg by mouth daily.   MAGNESIUM PO Take by mouth.   Multiple Vitamin (MULTIVITAMIN PO) Take by mouth.   Omega-3 Fatty Acids (FISH  OIL PO) Take by mouth.   omeprazole (PRILOSEC) 20 MG capsule Take 1 capsule (20 mg total) by mouth daily.   Triamcinolone Acetonide (TRIAMCINOLONE 0.1 % CREAM : EUCERIN) CREA Apply 1 Application topically 2 (two) times daily.   valACYclovir (VALTREX) 1000 MG tablet Take 1 tablet (1,000 mg total) by mouth 3 (three) times daily.   VITAMIN D PO Take by mouth.   VITAMIN E PO Take by mouth.   No facility-administered encounter medications on file as of 10/19/2023.    Past Surgical History:  Procedure Laterality Date   COLONOSCOPY  04-01-2004    Family History  Problem Relation Age of Onset   Ovarian cancer Mother    Breast cancer Neg Hx       Controlled substance contract: ***     Review of Systems     Objective:   Physical Exam        Assessment & Plan:

## 2023-10-19 NOTE — Progress Notes (Signed)
   Subjective:    Patient ID: Mikayla Knight, female    DOB: 08/09/1951, 72 y.o.   MRN: [id number]   Chief Complaint: Dysuria   Dysuria  This is a new problem. The current episode started in the past 7 days. The problem occurs intermittently. The problem has been waxing and waning. The quality of the pain is described as burning. The pain is at a severity of 3/10. The pain is mild. There has been no fever. Associated symptoms include flank pain, hesitancy and urgency. She has tried nothing for the symptoms. The treatment provided mild relief.      Patient Active Problem List   Diagnosis Date Noted   Gastroesophageal reflux disease without esophagitis 01/15/2023   Obstructive sleep apnea syndrome 06/23/2022   HSV-1 (herpes simplex virus 1) infection 04/09/2018   BMI 33.0-33.9,adult 03/05/2017   Depression 09/29/2013   Hyperlipidemia with target LDL less than 100 09/29/2013       Review of Systems  Constitutional:  Negative for diaphoresis.  Eyes:  Negative for pain.  Respiratory:  Negative for shortness of breath.   Cardiovascular:  Negative for chest pain, palpitations and leg swelling.  Gastrointestinal:  Negative for abdominal pain.  Endocrine: Negative for polydipsia.  Genitourinary:  Positive for dysuria, flank pain, hesitancy and urgency.  Skin:  Negative for rash.  Neurological:  Negative for dizziness, weakness and headaches.  Hematological:  Does not bruise/bleed easily.  All other systems reviewed and are negative.      Objective:   Physical Exam Constitutional:      Appearance: Normal appearance.  Cardiovascular:     Rate and Rhythm: Normal rate and regular rhythm.     Heart sounds: Normal heart sounds.  Pulmonary:     Effort: Pulmonary effort is normal.     Breath sounds: Normal breath sounds.  Abdominal:     Tenderness: There is abdominal tenderness. There is no right CVA tenderness or left CVA tenderness.  Skin:    General: Skin is warm.   Neurological:     General: No focal deficit present.     Mental Status: She is alert and oriented to person, place, and time.  Psychiatric:        Mood and Affect: Mood normal.        Behavior: Behavior normal.     BP 128/72   Pulse 81   Temp (!) 96.9 F (36.1 C) (Temporal)   Resp 20   Ht 5\' 6"  (1.676 m)   Wt 210 lb (95.3 kg)   SpO2 99%   BMI 33.89 kg/m   Urine clear      Assessment & Plan:   Mikayla Knight in today with chief complaint of Dysuria   1. Dysuria Force fluids AZO as needed - Urinalysis, Complete - Urine Culture    The above assessment and management plan was discussed with the patient. The patient verbalized understanding of and has agreed to the management plan. Patient is aware to call the clinic if symptoms persist or worsen. Patient is aware when to return to the clinic for a follow-up visit. Patient educated on when it is appropriate to go to the emergency department.   Mary-Margaret Daphine Deutscher, FNP

## 2023-10-21 LAB — URINE CULTURE

## 2023-12-23 ENCOUNTER — Telehealth: Payer: Self-pay

## 2023-12-23 NOTE — Telephone Encounter (Signed)
Lmtcb to schedule an apt to be seen

## 2023-12-23 NOTE — Telephone Encounter (Signed)
Copied from CRM [id number]. Topic: Clinical - Medication Question >> Dec 23, 2023  8:55 AM Mikayla Knight wrote: Reason for CRM: Pt wants to know if she could be prescribed a Z pack for her sinus infection or does she need to come in for an appointment.  Callback# [phone]

## 2023-12-24 ENCOUNTER — Encounter: Payer: Self-pay | Admitting: Nurse Practitioner

## 2023-12-24 ENCOUNTER — Ambulatory Visit: Payer: BC Managed Care – PPO | Admitting: Nurse Practitioner

## 2023-12-24 VITALS — BP 118/71 | HR 78 | Temp 97.3°F | Ht 66.0 in | Wt 211.0 lb

## 2023-12-24 DIAGNOSIS — J069 Acute upper respiratory infection, unspecified: Secondary | ICD-10-CM

## 2023-12-24 MED ORDER — AZITHROMYCIN 250 MG PO TABS: ORAL_TABLET | ORAL | 0 refills | Status: DC

## 2023-12-24 NOTE — Patient Instructions (Signed)
1. Take meds as prescribed 2. Use a cool mist humidifier especially during the winter months and when heat has been humid. 3. Use saline nose sprays frequently 4. Saline irrigations of the nose can be very helpful if done frequently.  * 4X daily for 1 week*  * Use of a nettie pot can be helpful with this. Follow directions with this* 5. Drink plenty of fluids 6. Keep thermostat turn down low 7.For any cough or congestion- mucinex DM 8. For fever or aces or pains- take tylenol or ibuprofen appropriate for age and weight.  * for fevers greater than 101 orally you may alternate ibuprofen and tylenol every  3 hours.

## 2023-12-24 NOTE — Progress Notes (Signed)
Subjective:    Patient ID: Mikayla Knight, female    DOB: Mar 26, 1951, 73 y.o.   MRN: [id number]   Chief Complaint: Nasal Congestion   URI  This is a new problem. The current episode started in the past 7 days. The problem has been waxing and waning. The maximum temperature recorded prior to her arrival was 100.4 - 100.9 F. The fever has been present for 1 to 2 days. Associated symptoms include congestion, coughing (slight) and rhinorrhea. Treatments tried: mucinex. The treatment provided mild relief.    Patient Active Problem List   Diagnosis Date Noted   Gastroesophageal reflux disease without esophagitis 01/15/2023   Obstructive sleep apnea syndrome 06/23/2022   HSV-1 (herpes simplex virus 1) infection 04/09/2018   BMI 33.0-33.9,adult 03/05/2017   Depression 09/29/2013   Hyperlipidemia with target LDL less than 100 09/29/2013       Review of Systems  Constitutional:  Negative for chills, fatigue and fever.  HENT:  Positive for congestion and rhinorrhea.   Respiratory:  Positive for cough (slight).        Objective:   Physical Exam Constitutional:      Appearance: Normal appearance. She is obese.  Cardiovascular:     Rate and Rhythm: Normal rate and regular rhythm.     Heart sounds: Normal heart sounds.  Pulmonary:     Effort: Pulmonary effort is normal.     Breath sounds: Normal breath sounds.  Skin:    General: Skin is warm.  Neurological:     General: No focal deficit present.     Mental Status: She is alert and oriented to person, place, and time.  Psychiatric:        Mood and Affect: Mood normal.        Behavior: Behavior normal.    BP 118/71   Pulse 78   Temp (!) 97.3 F (36.3 C) (Temporal)   Ht 5\' 6"  (1.676 m)   Wt 211 lb (95.7 kg)   SpO2 98%   BMI 34.06 kg/m         Assessment & Plan:   Giulliana Knight in today with chief complaint of Nasal Congestion   1. URI with cough and congestion (Primary) 1. Take meds as prescribed 2. Use a  cool mist humidifier especially during the winter months and when heat has been humid. 3. Use saline nose sprays frequently 4. Saline irrigations of the nose can be very helpful if done frequently.  * 4X daily for 1 week*  * Use of a nettie pot can be helpful with this. Follow directions with this* 5. Drink plenty of fluids 6. Keep thermostat turn down low 7.For any cough or congestion- mucinex DM 8. For fever or aces or pains- take tylenol or ibuprofen appropriate for age and weight.  * for fevers greater than 101 orally you may alternate ibuprofen and tylenol every  3 hours.   Meds ordered this encounter  Medications   azithromycin (ZITHROMAX Z-PAK) 250 MG tablet    Sig: As directed    Dispense:  6 tablet    Refill:  0    Supervising Provider:   Arville Care A [[id number]]       The above assessment and management plan was discussed with the patient. The patient verbalized understanding of and has agreed to the management plan. Patient is aware to call the clinic if symptoms persist or worsen. Patient is aware when to return to the clinic for a follow-up visit. Patient educated on  when it is appropriate to go to the emergency department.   Mary-Margaret Daphine Deutscher, FNP

## 2024-01-15 ENCOUNTER — Ambulatory Visit: Payer: BC Managed Care – PPO | Admitting: Nurse Practitioner

## 2024-01-15 ENCOUNTER — Encounter: Payer: Self-pay | Admitting: Nurse Practitioner

## 2024-01-15 VITALS — BP 121/70 | HR 79 | Ht 66.0 in | Wt 211.0 lb

## 2024-01-15 DIAGNOSIS — K219 Gastro-esophageal reflux disease without esophagitis: Secondary | ICD-10-CM | POA: Diagnosis not present

## 2024-01-15 DIAGNOSIS — F3342 Major depressive disorder, recurrent, in full remission: Secondary | ICD-10-CM | POA: Diagnosis not present

## 2024-01-15 DIAGNOSIS — G4733 Obstructive sleep apnea (adult) (pediatric): Secondary | ICD-10-CM | POA: Diagnosis not present

## 2024-01-15 DIAGNOSIS — E785 Hyperlipidemia, unspecified: Secondary | ICD-10-CM

## 2024-01-15 DIAGNOSIS — Z6833 Body mass index (BMI) 33.0-33.9, adult: Secondary | ICD-10-CM

## 2024-01-15 MED ORDER — ESCITALOPRAM OXALATE 20 MG PO TABS: 20.0000 mg | ORAL_TABLET | Freq: Every day | ORAL | 1 refills | Status: DC

## 2024-01-15 MED ORDER — ATORVASTATIN CALCIUM 40 MG PO TABS: 40.0000 mg | ORAL_TABLET | Freq: Every day | ORAL | 1 refills | Status: DC

## 2024-01-15 MED ORDER — OMEPRAZOLE 20 MG PO CPDR: 20.0000 mg | DELAYED_RELEASE_CAPSULE | Freq: Every day | ORAL | 1 refills | Status: DC

## 2024-01-15 NOTE — Patient Instructions (Signed)

## 2024-01-15 NOTE — Progress Notes (Signed)
 Subjective:    Patient ID: Mikayla Knight, female    DOB: 1951-05-04, 73 y.o.   MRN: [id number]   Chief Complaint: medical management of chronic issues     HPI:  Mikayla Knight is a 73 y.o. who identifies as a female who was assigned female at birth.   Social history: Lives with: by herself Work history: works in [employer] in today for follow up of the following chronic medical issues:  1. Hyperlipidemia with target LDL less than 100 Does not watch diet and does very little exercise. Lab Results  Component Value Date   CHOL 187 07/13/2023   HDL 54 07/13/2023   LDLCALC 103 (H) 07/13/2023   TRIG 171 (H) 07/13/2023   CHOLHDL 3.5 07/13/2023   The 10-year ASCVD risk score (Arnett DK, et al., 2019) is: 9.4%   2. Recurrent major depressive disorder, in full remission (HCC) Has been on lexapro for several years. Is doing ok.    01/15/2024    4:26 PM 12/24/2023    3:28 PM 10/19/2023    8:38 AM  Depression screen PHQ 2/9  Decreased Interest 0 0 0  Down, Depressed, Hopeless 0 0 0  PHQ - 2 Score 0 0 0  Altered sleeping   0  Tired, decreased energy   0  Change in appetite   0  Feeling bad or failure about yourself    0  Trouble concentrating   0  Moving slowly or fidgety/restless   0  Suicidal thoughts   0  PHQ-9 Score   0  Difficult doing work/chores   Not difficult at all      3. Obstructive sleep apnea syndrome Wears cpap nightly  4. Gastroesophageal reflux disease without esophagitis Is on omeprazole daily and is dong well.  5. BMI 33.0-33.9,adult No recent weight changes  Wt Readings from Last 3 Encounters:  01/15/24 211 lb (95.7 kg)  12/24/23 211 lb (95.7 kg)  10/19/23 210 lb (95.3 kg)   BMI Readings from Last 3 Encounters:  01/15/24 34.06 kg/m  12/24/23 34.06 kg/m  10/19/23 33.89 kg/m      New complaints: None today  No Known Allergies Outpatient Encounter Medications as of 01/15/2024  Medication Sig   atorvastatin (LIPITOR) 40  MG tablet Take 1 tablet (40 mg total) by mouth daily. Needs to be seen for further refills   azithromycin (ZITHROMAX Z-PAK) 250 MG tablet As directed   celecoxib (CELEBREX) 200 MG capsule Take 1 capsule by mouth twice daily   Cyanocobalamin (B-12 PO) Take by mouth.   cyclobenzaprine (FLEXERIL) 10 MG tablet Take 1 tablet (10 mg total) by mouth 3 (three) times daily as needed. for muscle spams   Docusate Calcium (STOOL SOFTENER PO) Take by mouth.   escitalopram (LEXAPRO) 20 MG tablet Take 1 tablet (20 mg total) by mouth daily.   glucosamine-chondroitin 500-400 MG tablet Take 1 tablet by mouth 3 (three) times daily.   loratadine (CLARITIN) 10 MG tablet Take 10 mg by mouth daily.   MAGNESIUM PO Take by mouth.   Multiple Vitamin (MULTIVITAMIN PO) Take by mouth.   Omega-3 Fatty Acids (FISH OIL PO) Take by mouth.   omeprazole (PRILOSEC) 20 MG capsule Take 1 capsule (20 mg total) by mouth daily.   Triamcinolone Acetonide (TRIAMCINOLONE 0.1 % CREAM : EUCERIN) CREA Apply 1 Application topically 2 (two) times daily.   valACYclovir (VALTREX) 1000 MG tablet Take 1 tablet (1,000 mg total) by mouth 3 (three) times daily.  VITAMIN D PO Take by mouth.   VITAMIN E PO Take by mouth.   No facility-administered encounter medications on file as of 01/15/2024.    Past Surgical History:  Procedure Laterality Date   COLONOSCOPY  04-01-2004    Family History  Problem Relation Age of Onset   Ovarian cancer Mother    Breast cancer Neg Hx       Controlled substance contract: n/a     Review of Systems  Constitutional:  Negative for diaphoresis.  Eyes:  Negative for pain.  Respiratory:  Negative for shortness of breath.   Cardiovascular:  Negative for chest pain, palpitations and leg swelling.  Gastrointestinal:  Negative for abdominal pain.  Endocrine: Negative for polydipsia.  Skin:  Negative for rash.  Neurological:  Negative for dizziness, weakness and headaches.  Hematological:  Does not  bruise/bleed easily.  All other systems reviewed and are negative.      Objective:   Physical Exam Vitals and nursing note reviewed.  Constitutional:      General: She is not in acute distress.    Appearance: Normal appearance. She is well-developed.  HENT:     Head: Normocephalic.     Right Ear: Tympanic membrane normal.     Left Ear: Tympanic membrane normal.     Nose: Nose normal.     Mouth/Throat:     Mouth: Mucous membranes are moist.  Eyes:     Pupils: Pupils are equal, round, and reactive to light.  Neck:     Vascular: No carotid bruit or JVD.  Cardiovascular:     Rate and Rhythm: Normal rate and regular rhythm.     Heart sounds: Normal heart sounds.  Pulmonary:     Effort: Pulmonary effort is normal. No respiratory distress.     Breath sounds: Normal breath sounds. No wheezing or rales.  Chest:     Chest wall: No tenderness.  Abdominal:     General: Bowel sounds are normal. There is no distension or abdominal bruit.     Palpations: Abdomen is soft. There is no hepatomegaly, splenomegaly, mass or pulsatile mass.     Tenderness: There is no abdominal tenderness.  Musculoskeletal:        General: Normal range of motion.     Cervical back: Normal range of motion and neck supple.  Lymphadenopathy:     Cervical: No cervical adenopathy.  Skin:    General: Skin is warm and dry.  Neurological:     Mental Status: She is alert and oriented to person, place, and time.     Deep Tendon Reflexes: Reflexes are normal and symmetric.  Psychiatric:        Behavior: Behavior normal.        Thought Content: Thought content normal.        Judgment: Judgment normal.    BP 121/70   Pulse 79   Ht 5\' 6"  (1.676 m)   Wt 211 lb (95.7 kg)   SpO2 96%   BMI 34.06 kg/m          Assessment & Plan:  Mikayla Knight comes in today with chief complaint of medical management of chronic issues    Diagnosis and orders addressed:  1. Hyperlipidemia with target LDL less than  100 Low fat diet  2. Recurrent major depressive disorder, in full remission (HCC) Stress management - escitalopram (LEXAPRO) 20 MG tablet; Take 1 tablet (20 mg total) by mouth daily.  Dispense: 90 tablet; Refill: 1  3. Obstructive sleep  apnea syndrome Wear cpap nightly  4. Gastroesophageal reflux disease without esophagitis Avoid spicy foods Do not eat 2 hours prior to bedtime - omeprazole (PRILOSEC) 20 MG capsule; Take 1 capsule (20 mg total) by mouth daily.  Dispense: 90 capsule; Refill: 1  5. BMI 33.0-33.9,adult Discussed diet and exercise for person with BMI >25 Will recheck weight in 3-6 months   6. Intrinsic eczema - Triamcinolone Acetonide (TRIAMCINOLONE 0.1 % CREAM : EUCERIN) CREA; Apply 1 Application topically 2 (two) times daily.  Dispense: 1 each; Refill: 2  7. HSV-1 (herpes simplex virus 1) infection - valACYclovir (VALTREX) 1000 MG tablet; Take 1 tablet (1,000 mg total) by mouth 3 (three) times daily.  Dispense: 90 tablet; Refill: 1   Labs pending Health Maintenance reviewed Diet and exercise encouraged  Follow up plan: 6 months   Mary-Margaret Daphine Deutscher, FNP

## 2024-01-16 LAB — CBC WITH DIFFERENTIAL/PLATELET
Basophils Absolute: 0.1 10*3/uL (ref 0.0–0.2)
Basos: 1 %
EOS (ABSOLUTE): 0.2 10*3/uL (ref 0.0–0.4)
Eos: 2 %
Hematocrit: 40.4 % (ref 34.0–46.6)
Hemoglobin: 13.5 g/dL (ref 11.1–15.9)
Immature Grans (Abs): 0 10*3/uL (ref 0.0–0.1)
Immature Granulocytes: 0 %
Lymphocytes Absolute: 2.3 10*3/uL (ref 0.7–3.1)
Lymphs: 31 %
MCH: 32.4 pg (ref 26.6–33.0)
MCHC: 33.4 g/dL (ref 31.5–35.7)
MCV: 97 fL (ref 79–97)
Monocytes Absolute: 0.4 10*3/uL (ref 0.1–0.9)
Monocytes: 5 %
Neutrophils Absolute: 4.4 10*3/uL (ref 1.4–7.0)
Neutrophils: 61 %
Platelets: 253 10*3/uL (ref 150–450)
RBC: 4.17 x10E6/uL (ref 3.77–5.28)
RDW: 12.1 % (ref 11.7–15.4)
WBC: 7.3 10*3/uL (ref 3.4–10.8)

## 2024-01-16 LAB — CMP14+EGFR
ALT: 17 IU/L (ref 0–32)
AST: 21 IU/L (ref 0–40)
Albumin: 4.4 g/dL (ref 3.8–4.8)
Alkaline Phosphatase: 121 IU/L (ref 44–121)
BUN/Creatinine Ratio: 19 (ref 12–28)
BUN: 14 mg/dL (ref 8–27)
Bilirubin Total: 0.3 mg/dL (ref 0.0–1.2)
CO2: 25 mmol/L (ref 20–29)
Calcium: 9.5 mg/dL (ref 8.7–10.3)
Chloride: 102 mmol/L (ref 96–106)
Creatinine, Ser: 0.72 mg/dL (ref 0.57–1.00)
Globulin, Total: 2.4 g/dL (ref 1.5–4.5)
Glucose: 102 mg/dL — ABNORMAL HIGH (ref 70–99)
Potassium: 4.4 mmol/L (ref 3.5–5.2)
Sodium: 141 mmol/L (ref 134–144)
Total Protein: 6.8 g/dL (ref 6.0–8.5)
eGFR: 89 mL/min/{1.73_m2} (ref >59)

## 2024-01-16 LAB — LIPID PANEL
Chol/HDL Ratio: 3.7 ratio (ref 0.0–4.4)
Cholesterol, Total: 183 mg/dL (ref 100–199)
HDL: 49 mg/dL (ref >39)
LDL Chol Calc (NIH): 98 mg/dL (ref 0–99)
Triglycerides: 212 mg/dL — ABNORMAL HIGH (ref 0–149)
VLDL Cholesterol Cal: 36 mg/dL (ref 5–40)

## 2024-04-01 DIAGNOSIS — M48062 Spinal stenosis, lumbar region with neurogenic claudication: Secondary | ICD-10-CM | POA: Diagnosis not present

## 2024-04-01 DIAGNOSIS — Z6833 Body mass index (BMI) 33.0-33.9, adult: Secondary | ICD-10-CM | POA: Diagnosis not present

## 2024-04-12 DIAGNOSIS — M5116 Intervertebral disc disorders with radiculopathy, lumbar region: Secondary | ICD-10-CM | POA: Diagnosis not present

## 2024-04-12 DIAGNOSIS — M5416 Radiculopathy, lumbar region: Secondary | ICD-10-CM | POA: Diagnosis not present

## 2024-07-07 ENCOUNTER — Encounter: Payer: Self-pay | Admitting: Nurse Practitioner

## 2024-07-07 ENCOUNTER — Ambulatory Visit: Payer: BC Managed Care – PPO | Admitting: Nurse Practitioner

## 2024-07-07 VITALS — BP 141/76 | HR 77 | Temp 97.5°F | Ht 66.0 in | Wt 211.0 lb

## 2024-07-07 DIAGNOSIS — K219 Gastro-esophageal reflux disease without esophagitis: Secondary | ICD-10-CM

## 2024-07-07 DIAGNOSIS — G4733 Obstructive sleep apnea (adult) (pediatric): Secondary | ICD-10-CM

## 2024-07-07 DIAGNOSIS — B009 Herpesviral infection, unspecified: Secondary | ICD-10-CM

## 2024-07-07 DIAGNOSIS — K5904 Chronic idiopathic constipation: Secondary | ICD-10-CM

## 2024-07-07 DIAGNOSIS — Z6833 Body mass index (BMI) 33.0-33.9, adult: Secondary | ICD-10-CM

## 2024-07-07 DIAGNOSIS — M5441 Lumbago with sciatica, right side: Secondary | ICD-10-CM

## 2024-07-07 DIAGNOSIS — E785 Hyperlipidemia, unspecified: Secondary | ICD-10-CM

## 2024-07-07 DIAGNOSIS — F3342 Major depressive disorder, recurrent, in full remission: Secondary | ICD-10-CM | POA: Diagnosis not present

## 2024-07-07 MED ORDER — VALACYCLOVIR HCL 1 G PO TABS: 1000.0000 mg | ORAL_TABLET | Freq: Three times a day (TID) | ORAL | 1 refills | Status: AC

## 2024-07-07 MED ORDER — POLYETHYLENE GLYCOL 3350 17 GM/SCOOP PO POWD: 119.0000 g | Freq: Once | ORAL | 1 refills | Status: AC

## 2024-07-07 MED ORDER — OMEPRAZOLE 20 MG PO CPDR: 20.0000 mg | DELAYED_RELEASE_CAPSULE | Freq: Every day | ORAL | 1 refills | Status: AC

## 2024-07-07 MED ORDER — ATORVASTATIN CALCIUM 40 MG PO TABS: 40.0000 mg | ORAL_TABLET | Freq: Every day | ORAL | 1 refills | Status: AC

## 2024-07-07 MED ORDER — ESCITALOPRAM OXALATE 20 MG PO TABS
20.0000 mg | ORAL_TABLET | Freq: Every day | ORAL | 1 refills | Status: AC
Start: 2024-07-07 — End: ?

## 2024-07-07 MED ORDER — CYCLOBENZAPRINE HCL 10 MG PO TABS: 10.0000 mg | ORAL_TABLET | Freq: Three times a day (TID) | ORAL | 1 refills | Status: AC | PRN

## 2024-07-07 NOTE — Progress Notes (Signed)
 Subjective:    Patient ID: Mikayla Knight, female    DOB: [birth year], 73 y.o.   MRN: [id number]   Chief Complaint: medical management of chronic issues     HPI:  Mikayla Knight is a 73 y.o. who identifies as a female who was assigned female at birth.   Social history: Lives with: by herself Work history: works in [employer] in today for follow up of the following chronic medical issues:  1. Hyperlipidemia with target LDL less than 100 Does not watch diet and does very little exercise. Lab Results  Component Value Date   CHOL 183 01/15/2024   HDL 49 01/15/2024   LDLCALC 98 01/15/2024   TRIG 212 (H) 01/15/2024   CHOLHDL 3.7 01/15/2024   The 10-year ASCVD risk score (Arnett DK, et al., 2019) is: 9.2%   2. Recurrent major depressive disorder, in full remission (HCC) Has been on lexapro for several years. Is doing ok.    07/07/2024    4:13 PM 01/15/2024    4:26 PM 12/24/2023    3:28 PM  Depression screen PHQ 2/9  Decreased Interest 0 0 0  Down, Depressed, Hopeless 0 0 0  PHQ - 2 Score 0 0 0  Altered sleeping 0    Tired, decreased energy 3    Change in appetite 0    Feeling bad or failure about yourself  0    Trouble concentrating 0    Moving slowly or fidgety/restless 0    Suicidal thoughts 0    PHQ-9 Score 3    Difficult doing work/chores Not difficult at all        07/07/2024    4:14 PM 10/19/2023    8:38 AM 07/13/2023    4:07 PM 03/02/2023    3:35 PM  GAD 7 : Generalized Anxiety Score  Nervous, Anxious, on Edge 0 0 0 0  Control/stop worrying 0 0 0 0  Worry too much - different things 0 0 0 0  Trouble relaxing 0 0 0 0  Restless 0 0 0 0  Easily annoyed or irritable 0 0 0 0  Afraid - awful might happen 0 0 0 0  Total GAD 7 Score 0 0 0 0  Anxiety Difficulty Not difficult at all Not difficult at all Not difficult at all Not difficult at all     3. Obstructive sleep apnea syndrome Wears cpap nightly. Not sure it  is helping anymore. Feels tired all  the time   4. Gastroesophageal reflux disease without esophagitis Is on omeprazole daily and is dong well.  5. BMI 33.0-33.9,adult No recent weight changes Wt Readings from Last 3 Encounters:  07/07/24 211 lb (95.7 kg)  01/15/24 211 lb (95.7 kg)  12/24/23 211 lb (95.7 kg)   BMI Readings from Last 3 Encounters:  07/07/24 34.06 kg/m  01/15/24 34.06 kg/m  12/24/23 34.06 kg/m         New complaints: -Increasing anxiety- her kids are stressing her out. She is worrying all the time - constipation - straining which is causing bright red blood in stool. - episode of heart racing and sever fatigue. Has occurred 3 x but not in the last several months No Known Allergies Outpatient Encounter Medications as of 07/07/2024  Medication Sig   atorvastatin (LIPITOR) 40 MG tablet Take 1 tablet (40 mg total) by mouth daily. Needs to be seen for further refills   celecoxib (CELEBREX) 200 MG capsule Take 1 capsule by mouth twice daily  Cyanocobalamin  (B-12 PO) Take by mouth.   cyclobenzaprine  (FLEXERIL ) 10 MG tablet Take 1 tablet (10 mg total) by mouth 3 (three) times daily as needed. for muscle spams   Docusate Calcium  (STOOL SOFTENER PO) Take by mouth.   escitalopram  (LEXAPRO ) 20 MG tablet Take 1 tablet (20 mg total) by mouth daily.   glucosamine-chondroitin 500-400 MG tablet Take 1 tablet by mouth 3 (three) times daily.   loratadine (CLARITIN) 10 MG tablet Take 10 mg by mouth daily.   MAGNESIUM PO Take by mouth.   Multiple Vitamin (MULTIVITAMIN PO) Take by mouth.   Omega-3 Fatty Acids (FISH OIL PO) Take by mouth.   omeprazole  (PRILOSEC) 20 MG capsule Take 1 capsule (20 mg total) by mouth daily.   Triamcinolone  Acetonide (TRIAMCINOLONE  0.1 % CREAM : EUCERIN) CREA Apply 1 Application topically 2 (two) times daily.   valACYclovir  (VALTREX ) 1000 MG tablet Take 1 tablet (1,000 mg total) by mouth 3 (three) times daily.   VITAMIN D  PO Take by mouth.   VITAMIN E PO Take by mouth.   No  facility-administered encounter medications on file as of 07/07/2024.    Past Surgical History:  Procedure Laterality Date   COLONOSCOPY  04-01-2004    Family History  Problem Relation Age of Onset   Ovarian cancer Mother    Breast cancer Neg Hx       Controlled substance contract: n/a     Review of Systems  Constitutional:  Negative for diaphoresis.  Eyes:  Negative for pain.  Respiratory:  Negative for shortness of breath.   Cardiovascular:  Negative for chest pain, palpitations and leg swelling.  Gastrointestinal:  Negative for abdominal pain.  Endocrine: Negative for polydipsia.  Skin:  Negative for rash.  Neurological:  Negative for dizziness, weakness and headaches.  Hematological:  Does not bruise/bleed easily.  All other systems reviewed and are negative.      Objective:   Physical Exam Vitals and nursing note reviewed.  Constitutional:      General: She is not in acute distress.    Appearance: Normal appearance. She is well-developed.  HENT:     Head: Normocephalic.     Right Ear: Tympanic membrane normal.     Left Ear: Tympanic membrane normal.     Nose: Nose normal.     Mouth/Throat:     Mouth: Mucous membranes are moist.  Eyes:     Pupils: Pupils are equal, round, and reactive to light.  Neck:     Vascular: No carotid bruit or JVD.  Cardiovascular:     Rate and Rhythm: Normal rate and regular rhythm.     Heart sounds: Normal heart sounds.  Pulmonary:     Effort: Pulmonary effort is normal. No respiratory distress.     Breath sounds: Normal breath sounds. No wheezing or rales.  Chest:     Chest wall: No tenderness.  Abdominal:     General: Bowel sounds are normal. There is no distension or abdominal bruit.     Palpations: Abdomen is soft. There is no hepatomegaly, splenomegaly, mass or pulsatile mass.     Tenderness: There is no abdominal tenderness.  Musculoskeletal:        General: Normal range of motion.     Cervical back: Normal range of  motion and neck supple.  Lymphadenopathy:     Cervical: No cervical adenopathy.  Skin:    General: Skin is warm and dry.  Neurological:     Mental Status: She is alert and  oriented to person, place, and time.     Deep Tendon Reflexes: Reflexes are normal and symmetric.  Psychiatric:        Behavior: Behavior normal.        Thought Content: Thought content normal.        Judgment: Judgment normal.    There were no vitals taken for this visit.         Assessment & Plan:  Mikayla Knight comes in today with chief complaint of medical management of chronic issues    Diagnosis and orders addressed:  1. Hyperlipidemia with target LDL less than 100 Low fat diet  2. Recurrent major depressive disorder, in full remission (HCC) Stress management - escitalopram  (LEXAPRO ) 20 MG tablet; Take 1 tablet (20 mg total) by mouth daily.  Dispense: 90 tablet; Refill: 1  3. Obstructive sleep apnea syndrome Wear cpap nightly  4. Gastroesophageal reflux disease without esophagitis Avoid spicy foods Do not eat 2 hours prior to bedtime - omeprazole  (PRILOSEC) 20 MG capsule; Take 1 capsule (20 mg total) by mouth daily.  Dispense: 90 capsule; Refill: 1  5. BMI 33.0-33.9,adult Discussed diet and exercise for person with BMI >25 Will recheck weight in 3-6 months   6. Intrinsic eczema - Triamcinolone  Acetonide (TRIAMCINOLONE  0.1 % CREAM : EUCERIN) CREA; Apply 1 Application topically 2 (two) times daily.  Dispense: 1 each; Refill: 2  7. HSV-1 (herpes simplex virus 1) infection - valACYclovir  (VALTREX ) 1000 MG tablet; Take 1 tablet (1,000 mg total) by mouth 3 (three) times daily.  Dispense: 90 tablet; Refill: 1  8. Sinus tachycardia Avoid caffeine Keep diary of event Discussed valsalva maneuver  Labs pending Health Maintenance reviewed Diet and exercise encouraged  Follow up plan: 6 months   [name], FNP

## 2024-07-07 NOTE — Patient Instructions (Signed)
 Sinus Tachycardia  Sinus tachycardia is a fast heartbeat. In sinus tachycardia, the heart beats more than 100 times a minute. Sinus tachycardia starts in the part of the heart called the sinoatrial (SA) node. Sinus tachycardia may be harmless, or it may be a sign of a serious condition. What are the causes? This condition may be caused by: Exercise or exertion. A fever. Pain. Loss of body fluids (dehydration). Severe bleeding (hemorrhage). Anxiety and stress. Certain substances, including: Alcohol. Caffeine. Tobacco and nicotine products. Cold medicines. Illegal drugs. Medical conditions including: Heart disease. An infection. An overactive thyroid  (hyperthyroidism). A lack of red blood cells (anemia). What are the signs or symptoms? Symptoms of this condition include: A feeling that the heart is beating fast or unevenly (palpitations). Suddenly noticing your heartbeat (cardiac awareness). Lightheadedness. Tiredness (fatigue). Shortness of breath. Chest pain. Nausea. Fainting. How is this diagnosed? This condition is diagnosed with: A physical exam. Tests or monitoring, such as: Blood tests. An electrocardiogram (ECG). This test measures the electrical activity of the heart. Ambulatory cardiac monitor. This records your heartbeats for 24 hours or more. You may be referred to a heart specialist (cardiologist). How is this treated? Treatment for this condition depends on the cause. Treatment may involve: Treating the underlying condition. Taking new medicines or changing your current medicines as told by your health care provider. Making changes to your diet or lifestyle. Follow these instructions at home: Lifestyle  Do not use any products that contain nicotine or tobacco. These products include cigarettes, chewing tobacco, and vaping devices, such as e-cigarettes. If you need help quitting, ask your health care provider. Do not use illegal drugs, such as  cocaine. Learn relaxation methods to help you when you get stressed or anxious. These include deep breathing. Avoid caffeine or other stimulants, including herbal stimulants that are found in energy drinks. Alcohol use  Do not drink alcohol if: Your health care provider tells you not to drink. You are pregnant, may be pregnant, or are planning to become pregnant. If you drink alcohol: Limit how much you have to: 0-1 drink a day for women. 0-2 drinks a day for men. Know how much alcohol is in your drink. In the [place] one drink equals one 12 oz bottle of beer (355 mL), one 5 oz glass of wine (148 mL), or one 1 oz glass of hard liquor (44 mL). General instructions Drink enough fluids to keep your urine pale yellow. Take over-the-counter and prescription medicines only as told by your health care provider. Ask your health care provider about taking vitamins, herbs, and supplements. Contact a health care provider if: You have vomiting or diarrhea that does not go away. You have a fever. You have weakness or dizziness. You feel faint. Get help right away if: You have pain in your chest, upper arms, jaw, or neck. You have palpitations that do not go away. Summary In sinus tachycardia, the heart beats more than 100 times a minute. Sinus tachycardia may be harmless, or it may be a sign of a serious condition. Treatment for this condition depends on the cause or the underlying condition. Get help right away if you have pain in your chest, upper arms, jaw, or neck. This information is not intended to replace advice given to you by your health care provider. Make sure you discuss any questions you have with your health care provider. Document Revised: 03/11/2022 Document Reviewed: 03/11/2022 Elsevier Patient Education  2024 [employer].

## 2024-07-07 NOTE — Addendum Note (Signed)
 Addended by: Olly Shiner, MARY-MARGARET on: 07/07/2024 04:39 PM   Modules accepted: Orders

## 2024-07-08 ENCOUNTER — Ambulatory Visit: Payer: Self-pay | Admitting: Nurse Practitioner

## 2024-07-08 LAB — CMP14+EGFR
ALT: 16 IU/L (ref 0–32)
AST: 18 IU/L (ref 0–40)
Albumin: 4.6 g/dL (ref 3.8–4.8)
Alkaline Phosphatase: 101 IU/L (ref 44–121)
BUN/Creatinine Ratio: 24 (ref 12–28)
BUN: 16 mg/dL (ref 8–27)
Bilirubin Total: 0.4 mg/dL (ref 0.0–1.2)
CO2: 19 mmol/L — ABNORMAL LOW (ref 20–29)
Calcium: 9.5 mg/dL (ref 8.7–10.3)
Chloride: 105 mmol/L (ref 96–106)
Creatinine, Ser: 0.66 mg/dL (ref 0.57–1.00)
Globulin, Total: 2.7 g/dL (ref 1.5–4.5)
Glucose: 85 mg/dL (ref 70–99)
Potassium: 4.4 mmol/L (ref 3.5–5.2)
Sodium: 144 mmol/L (ref 134–144)
Total Protein: 7.3 g/dL (ref 6.0–8.5)
eGFR: 93 mL/min/1.73 (ref >59)

## 2024-07-08 LAB — CBC WITH DIFFERENTIAL/PLATELET
Basophils Absolute: 0.1 x10E3/uL (ref 0.0–0.2)
Basos: 1 %
EOS (ABSOLUTE): 0.2 x10E3/uL (ref 0.0–0.4)
Eos: 3 %
Hematocrit: 42.3 % (ref 34.0–46.6)
Hemoglobin: 14.3 g/dL (ref 11.1–15.9)
Immature Grans (Abs): 0 x10E3/uL (ref 0.0–0.1)
Immature Granulocytes: 0 %
Lymphocytes Absolute: 2.1 x10E3/uL (ref 0.7–3.1)
Lymphs: 30 %
MCH: 32.9 pg (ref 26.6–33.0)
MCHC: 33.8 g/dL (ref 31.5–35.7)
MCV: 97 fL (ref 79–97)
Monocytes Absolute: 0.4 x10E3/uL (ref 0.1–0.9)
Monocytes: 6 %
Neutrophils Absolute: 4.2 x10E3/uL (ref 1.4–7.0)
Neutrophils: 60 %
Platelets: 238 x10E3/uL (ref 150–450)
RBC: 4.35 x10E6/uL (ref 3.77–5.28)
RDW: 12.6 % (ref 11.7–15.4)
WBC: 6.9 x10E3/uL (ref 3.4–10.8)

## 2024-07-08 LAB — VITAMIN D 25 HYDROXY (VIT D DEFICIENCY, FRACTURES): Vit D, 25-Hydroxy: 35.3 ng/mL (ref 30.0–100.0)

## 2024-07-08 LAB — LIPID PANEL
Chol/HDL Ratio: 3.8 ratio (ref 0.0–4.4)
Cholesterol, Total: 233 mg/dL — ABNORMAL HIGH (ref 100–199)
HDL: 61 mg/dL (ref >39)
LDL Chol Calc (NIH): 151 mg/dL — ABNORMAL HIGH (ref 0–99)
Triglycerides: 117 mg/dL (ref 0–149)
VLDL Cholesterol Cal: 21 mg/dL (ref 5–40)

## 2024-07-08 LAB — THYROID PANEL WITH TSH
Free Thyroxine Index: 1.9 (ref 1.2–4.9)
T3 Uptake Ratio: 21 % — ABNORMAL LOW (ref 24–39)
T4, Total: 9.1 ug/dL (ref 4.5–12.0)
TSH: 1.8 u[IU]/mL (ref 0.450–4.500)

## 2024-07-08 LAB — VITAMIN B12: Vitamin B-12: 1035 pg/mL (ref 232–1245)

## 2024-07-15 ENCOUNTER — Ambulatory Visit: Payer: Self-pay

## 2024-07-15 NOTE — Telephone Encounter (Signed)
 Patient wants her PCP's response on why he CO2 level is lower in this recent labwork than it had been in the past  FYI Only or Action Required?: Action required by provider: clinical question for provider.  Patient was last seen in primary care on 07/07/2024 by Gladis Mustard, FNP.  Called Nurse Triage reporting Labs Only.  Triage Disposition: Call PCP When Office is Open  Patient/caregiver understands and will follow disposition?: Yes       Copied from CRM [id number]. Topic: Clinical - Medical Advice >> Jul 15, 2024 12:52 PM Delon HERO wrote: Reason for CRM: Patient is calling to ask why her C02 is so low. Reporting that she feels fine. Only fatigue   Reason for Disposition  [1] Caller requesting NON-URGENT health information AND [2] PCP's office is the best resource  Answer Assessment - Initial Assessment Questions Patient was just calling to find out why her CO2 level was low in her recent labwork. She has seen the message through MyChart where her PCP has reviewed her recent labwork but she did not address anything about the CO2 level Patient states she would like to speak with her PCP about this & why is it lower than it had been in the past  Patient states that her work number [phone] is where she will be Monday through Friday 8am-5pm and usually has lunch 12pm-1pm  Patient does not report any changes and states she is still just tired all the time but this was discussed with her provider at the recent visit. Patient states that she was told by someone that her PCP was not in the office at this time so she said she would wait until next week for her PCP to review this and contact her back. Patient is advised that if anything worsens to go to the Emergency Room. Patient verbalized understanding.  Protocols used: Information Only Call - No Triage-A-AH

## 2024-07-15 NOTE — Telephone Encounter (Signed)
 First Attempt: LVM for patient to return call to [phone]  Copied from CRM #[id number]. Topic: Clinical - Medical Advice >> Jul 15, 2024 12:52 PM Delon HERO wrote: Reason for CRM: Patient is calling to ask why her C02 is so low. Reporting that she feels fine. Only fatigue

## 2024-07-22 DIAGNOSIS — G4733 Obstructive sleep apnea (adult) (pediatric): Secondary | ICD-10-CM | POA: Diagnosis not present

## 2024-07-28 ENCOUNTER — Other Ambulatory Visit: Payer: Self-pay | Admitting: Nurse Practitioner

## 2024-07-28 MED ORDER — AZITHROMYCIN 250 MG PO TABS: ORAL_TABLET | ORAL | 0 refills | Status: DC

## 2024-07-28 NOTE — Addendum Note (Signed)
 Addended by: Oluwanifemi Susman, MARY-MARGARET on: 07/28/2024 01:48 PM   Modules accepted: Orders

## 2024-07-30 DIAGNOSIS — J029 Acute pharyngitis, unspecified: Secondary | ICD-10-CM | POA: Diagnosis not present

## 2024-07-30 DIAGNOSIS — U071 COVID-19: Secondary | ICD-10-CM | POA: Diagnosis not present

## 2024-08-23 ENCOUNTER — Encounter: Payer: Self-pay | Admitting: Nurse Practitioner

## 2024-08-23 ENCOUNTER — Ambulatory Visit: Admitting: Nurse Practitioner

## 2024-08-23 VITALS — BP 125/74 | HR 86 | Temp 97.1°F | Ht 66.0 in | Wt 208.0 lb

## 2024-08-23 DIAGNOSIS — J0100 Acute maxillary sinusitis, unspecified: Secondary | ICD-10-CM

## 2024-08-23 MED ORDER — AMOXICILLIN-POT CLAVULANATE 875-125 MG PO TABS: 1.0000 | ORAL_TABLET | Freq: Two times a day (BID) | ORAL | 0 refills | Status: DC

## 2024-08-23 NOTE — Patient Instructions (Signed)

## 2024-08-23 NOTE — Progress Notes (Signed)
 Subjective:    Patient ID: Mikayla Knight, female    DOB: [birth year], 73 y.o.   MRN: [id number]   Chief Complaint: Sinus Problem (Tested positive for Covid on 07/30/24. Took paxlovid and was better. Starting this past Saturday tickle in throat, cough) and Cough   Sinus Problem This is a new problem. The current episode started in the past 7 days. The problem has been waxing and waning since onset. There has been no fever. Mikayla Knight pain is at a severity of 5/10. The pain is moderate. Associated symptoms include congestion, coughing, headaches and sinus pressure. Pertinent negatives include no sore throat. Treatments tried: mucinex  DM and flonase. The treatment provided mild relief.    Patient in with sinus issues . She says she had covid the first of September . Tested positive with home test. He thinks she has covid again.  Patient Active Problem List   Diagnosis Date Noted   Gastroesophageal reflux disease without esophagitis 01/15/2023   Obstructive sleep apnea syndrome 06/23/2022   HSV-1 (herpes simplex virus 1) infection 04/09/2018   BMI 33.0-33.9,adult 03/05/2017   Depression 09/29/2013   Hyperlipidemia with target LDL less than 100 09/29/2013        Review of Systems  HENT:  Positive for congestion and sinus pressure. Negative for sore throat.   Respiratory:  Positive for cough.   Neurological:  Positive for headaches.       Objective:   Physical Exam Constitutional:      Appearance: Normal appearance.  HENT:     Right Ear: Tympanic membrane normal.     Left Ear: Tympanic membrane normal.     Nose: Congestion and rhinorrhea present.     Mouth/Throat:     Pharynx: No oropharyngeal exudate or posterior oropharyngeal erythema.  Cardiovascular:     Rate and Rhythm: Normal rate and regular rhythm.     Heart sounds: Normal heart sounds.  Pulmonary:     Effort: Pulmonary effort is normal.     Breath sounds: Normal breath sounds.  Skin:    General: Skin is warm.   Neurological:     General: No focal deficit present.     Mental Status: She is alert and oriented to person, place, and time.  Psychiatric:        Mood and Affect: Mood normal.        Behavior: Behavior normal.    BP 125/74   Pulse 86   Temp (!) 97.1 F (36.2 C) (Temporal)   Ht 5' 6 (1.676 m)   Wt 208 lb (94.3 kg)   SpO2 96%   BMI 33.57 kg/m         Assessment & Plan:  Mikayla Knight in today with chief complaint of Sinus Problem (Tested positive for Covid on 07/30/24. Took paxlovid and was better. Starting this past Saturday tickle in throat, cough) and Cough   1. Acute non-recurrent maxillary sinusitis (Primary) 1. Take meds as prescribed 2. Use a cool mist humidifier especially during the winter months and when heat has been humid. 3. Use saline nose sprays frequently 4. Saline irrigations of the nose can be very helpful if done frequently.  * 4X daily for 1 week*  * Use of a nettie pot can be helpful with this. Follow directions with this* 5. Drink plenty of fluids 6. Keep thermostat turn down low 7.For any cough or congestion- mucinex  8. For fever or aces or pains- take tylenol or ibuprofen appropriate for age and weight.  * for  fevers greater than 101 orally you may alternate ibuprofen and tylenol every  3 hours.    - amoxicillin-clavulanate (AUGMENTIN) 875-125 MG tablet; Take 1 tablet by mouth 2 (two) times daily.  Dispense: 14 tablet; Refill: 0    The above assessment and management plan was discussed with the patient. The patient verbalized understanding of and has agreed to the management plan. Patient is aware to call the clinic if symptoms persist or worsen. Patient is aware when to return to the clinic for a follow-up visit. Patient educated on when it is appropriate to go to the emergency department.   [name], FNP

## 2024-09-07 ENCOUNTER — Ambulatory Visit: Payer: Self-pay

## 2024-09-07 NOTE — Telephone Encounter (Signed)
 FYI Only or Action Required?: FYI only for provider.  Patient was last seen in primary care on 08/23/2024 by Gladis Mustard, FNP.  Called Nurse Triage reporting Arm Pain.  Symptoms began yesterday.  Interventions attempted: Nothing.  Symptoms are: unchanged.  Triage Disposition: See PCP When Office is Open (Within 3 Days)  Patient/caregiver understands and will follow disposition?: Yes  Copied from CRM #[id number]. Topic: Clinical - Red Word Triage >> Sep 07, 2024  2:17 PM Joesph NOVAK wrote: Red Word that prompted transfer to Nurse Triage:  Pain in right arm, last night it bothered her more. Reason for Disposition  [1] MODERATE pain (e.g., interferes with normal activities) AND [2] present > 3 days  Answer Assessment - Initial Assessment Questions No available appts today pcp. Scheduled appt tomorrow, 09/07/24. Advised UC/ED if symptoms worsen.  1. ONSET: When did the pain start?     Months ago, worse last night 2. LOCATION: Where is the pain located?    Right upper arm and shoulder; unsure swelling, redness, warm to touch 3. PAIN: How bad is the pain? (Scale 0-10; or none, mild, moderate, severe)     Putting wt on it, no limit rom, mild pain at rest, tenderness, pain comes and goes; denies numbness or weakness 4. CAUSE: What do you think is causing the arm pain?     May be muscular  5. OTHER SYMPTOMS: Do you have any other symptoms? (e.g., neck pain, swelling, rash, fever, numbness, weakness)     Denies diff breathing, chest pain, neck pain, fever, chills, rash  Protocols used: Arm Pain-A-AH

## 2024-09-08 ENCOUNTER — Encounter: Payer: Self-pay | Admitting: Nurse Practitioner

## 2024-09-08 ENCOUNTER — Ambulatory Visit: Admitting: Nurse Practitioner

## 2024-09-08 VITALS — BP 111/66 | HR 71 | Temp 97.8°F | Ht 66.0 in | Wt 209.0 lb

## 2024-09-08 DIAGNOSIS — M79621 Pain in right upper arm: Secondary | ICD-10-CM | POA: Diagnosis not present

## 2024-09-08 NOTE — Progress Notes (Signed)
   Subjective:    Patient ID: Mikayla Knight, female    DOB: 1951-10-21, 73 y.o.   MRN: [id number]   Chief Complaint: Right upper arm pain (No injury)   Patient in c/o right arm pain. Is intermittently and radiates up her arm.  Arm Pain  There was no injury mechanism. The pain is present in the upper right arm. The quality of the pain is described as aching. The pain does not radiate. The pain is at a severity of 2/10. The pain has been Intermittent since the incident. Pertinent negatives include no chest pain, muscle weakness or tingling. The symptoms are aggravated by movement. She has tried nothing for the symptoms.      Patient Active Problem List   Diagnosis Date Noted   Gastroesophageal reflux disease without esophagitis 01/15/2023   Obstructive sleep apnea syndrome 06/23/2022   HSV-1 (herpes simplex virus 1) infection 04/09/2018   BMI 33.0-33.9,adult 03/05/2017   Depression 09/29/2013   Hyperlipidemia with target LDL less than 100 09/29/2013       Review of Systems  Constitutional:  Negative for diaphoresis.  Eyes:  Negative for pain.  Respiratory:  Negative for shortness of breath.   Cardiovascular:  Negative for chest pain, palpitations and leg swelling.  Gastrointestinal:  Negative for abdominal pain.  Endocrine: Negative for polydipsia.  Skin:  Negative for rash.  Neurological:  Negative for dizziness, tingling, weakness and headaches.  Hematological:  Does not bruise/bleed easily.  All other systems reviewed and are negative.      Objective:   Physical Exam Constitutional:      Appearance: Normal appearance.  Cardiovascular:     Rate and Rhythm: Normal rate and regular rhythm.     Heart sounds: Normal heart sounds.  Pulmonary:     Breath sounds: Normal breath sounds.  Musculoskeletal:     Comments: Right upper arm pain on palpation FROM of right shoulder without pain  Neurological:     General: No focal deficit present.     Mental Status: She is  alert and oriented to person, place, and time.  Psychiatric:        Mood and Affect: Mood normal.        Behavior: Behavior normal.     BP 111/66   Pulse 71   Temp 97.8 F (36.6 C) (Temporal)   Ht 5' 6 (1.676 m)   Wt 209 lb (94.8 kg)   SpO2 96%   BMI 33.73 kg/m        Assessment & Plan:   Mikayla Knight in today with chief complaint of Right upper arm pain (No injury)   1. Pain in right upper arm (Primary) Icy hot or something similar TENS unit should help Motrin or tylenol OTC as needed RTO prn    The above assessment and management plan was discussed with the patient. The patient verbalized understanding of and has agreed to the management plan. Patient is aware to call the clinic if symptoms persist or worsen. Patient is aware when to return to the clinic for a follow-up visit. Patient educated on when it is appropriate to go to the emergency department.   Mikayla Gladis, FNP

## 2025-01-10 ENCOUNTER — Ambulatory Visit: Payer: Self-pay | Admitting: Nurse Practitioner
